# Patient Record
Sex: Female | Born: 1980 | Race: White | Hispanic: No | Marital: Single | Smoking: Current every day smoker
Health system: Southern US, Community
[De-identification: ages and names within clinical notes are randomized; demographics above are authoritative.]

## PROBLEM LIST (undated history)

## (undated) DIAGNOSIS — F32A Depression, unspecified: Secondary | ICD-10-CM

## (undated) DIAGNOSIS — F419 Anxiety disorder, unspecified: Secondary | ICD-10-CM

## (undated) DIAGNOSIS — M329 Systemic lupus erythematosus, unspecified: Secondary | ICD-10-CM

## (undated) DIAGNOSIS — D649 Anemia, unspecified: Secondary | ICD-10-CM

## (undated) DIAGNOSIS — IMO0002 Reserved for concepts with insufficient information to code with codable children: Secondary | ICD-10-CM

## (undated) DIAGNOSIS — M1990 Unspecified osteoarthritis, unspecified site: Secondary | ICD-10-CM

## (undated) DIAGNOSIS — F329 Major depressive disorder, single episode, unspecified: Secondary | ICD-10-CM

## (undated) DIAGNOSIS — F1910 Other psychoactive substance abuse, uncomplicated: Secondary | ICD-10-CM

## (undated) HISTORY — PX: OTHER SURGICAL HISTORY: SHX169

## (undated) HISTORY — PX: ABDOMINAL HYSTERECTOMY: SHX81

## (undated) HISTORY — PX: WISDOM TOOTH EXTRACTION: SHX21

---
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
## 2000-04-01 ENCOUNTER — Inpatient Hospital Stay (HOSPITAL_COMMUNITY): Admission: AD | Admit: 2000-04-01 | Discharge: 2000-04-01 | Payer: Self-pay | Admitting: *Deleted

## 2000-04-04 ENCOUNTER — Encounter: Payer: Self-pay | Admitting: Obstetrics

## 2000-04-04 ENCOUNTER — Inpatient Hospital Stay (HOSPITAL_COMMUNITY): Admission: AD | Admit: 2000-04-04 | Discharge: 2000-04-04 | Payer: Self-pay | Admitting: *Deleted

## 2000-04-12 ENCOUNTER — Inpatient Hospital Stay (HOSPITAL_COMMUNITY): Admission: AD | Admit: 2000-04-12 | Discharge: 2000-04-12 | Payer: Self-pay | Admitting: *Deleted

## 2000-04-17 ENCOUNTER — Inpatient Hospital Stay (HOSPITAL_COMMUNITY): Admission: AD | Admit: 2000-04-17 | Discharge: 2000-04-17 | Payer: Self-pay | Admitting: Obstetrics & Gynecology

## 2000-08-20 ENCOUNTER — Other Ambulatory Visit: Admission: RE | Admit: 2000-08-20 | Discharge: 2000-08-20 | Payer: Self-pay | Admitting: Obstetrics & Gynecology

## 2000-08-20 ENCOUNTER — Other Ambulatory Visit: Admission: RE | Admit: 2000-08-20 | Discharge: 2000-08-20 | Payer: Self-pay | Admitting: Family Medicine

## 2001-02-02 ENCOUNTER — Other Ambulatory Visit: Admission: RE | Admit: 2001-02-02 | Discharge: 2001-02-02 | Payer: Self-pay | Admitting: Obstetrics & Gynecology

## 2001-02-02 ENCOUNTER — Encounter (INDEPENDENT_AMBULATORY_CARE_PROVIDER_SITE_OTHER): Payer: Self-pay | Admitting: Specialist

## 2002-03-26 ENCOUNTER — Inpatient Hospital Stay (HOSPITAL_COMMUNITY): Admission: AD | Admit: 2002-03-26 | Discharge: 2002-03-26 | Payer: Self-pay | Admitting: *Deleted

## 2002-03-26 ENCOUNTER — Encounter: Payer: Self-pay | Admitting: *Deleted

## 2002-04-20 ENCOUNTER — Inpatient Hospital Stay (HOSPITAL_COMMUNITY): Admission: AD | Admit: 2002-04-20 | Discharge: 2002-04-20 | Payer: Self-pay | Admitting: *Deleted

## 2002-10-28 ENCOUNTER — Inpatient Hospital Stay (HOSPITAL_COMMUNITY): Admission: AD | Admit: 2002-10-28 | Discharge: 2002-10-28 | Payer: Self-pay | Admitting: Family Medicine

## 2002-10-28 ENCOUNTER — Encounter: Payer: Self-pay | Admitting: Obstetrics and Gynecology

## 2002-11-28 ENCOUNTER — Inpatient Hospital Stay (HOSPITAL_COMMUNITY): Admission: AD | Admit: 2002-11-28 | Discharge: 2002-11-28 | Payer: Self-pay | Admitting: *Deleted

## 2002-12-12 ENCOUNTER — Inpatient Hospital Stay (HOSPITAL_COMMUNITY): Admission: AD | Admit: 2002-12-12 | Discharge: 2002-12-12 | Payer: Self-pay | Admitting: *Deleted

## 2002-12-12 ENCOUNTER — Encounter: Payer: Self-pay | Admitting: Family Medicine

## 2003-01-05 ENCOUNTER — Other Ambulatory Visit: Admission: RE | Admit: 2003-01-05 | Discharge: 2003-01-05 | Payer: Self-pay | Admitting: Obstetrics and Gynecology

## 2004-07-30 ENCOUNTER — Encounter: Admission: RE | Admit: 2004-07-30 | Discharge: 2004-07-30 | Payer: Self-pay | Admitting: Family Medicine

## 2004-09-05 ENCOUNTER — Ambulatory Visit: Payer: Self-pay | Admitting: Family Medicine

## 2004-12-06 ENCOUNTER — Ambulatory Visit: Payer: Self-pay | Admitting: Family Medicine

## 2005-01-02 ENCOUNTER — Ambulatory Visit: Payer: Self-pay | Admitting: Family Medicine

## 2005-01-23 ENCOUNTER — Ambulatory Visit: Payer: Self-pay | Admitting: Internal Medicine

## 2005-04-07 HISTORY — PX: SEPTOPLASTY: SHX2393

## 2005-04-29 ENCOUNTER — Ambulatory Visit (HOSPITAL_COMMUNITY): Admission: RE | Admit: 2005-04-29 | Discharge: 2005-04-29 | Payer: Self-pay | Admitting: Otolaryngology

## 2005-04-29 ENCOUNTER — Ambulatory Visit (HOSPITAL_BASED_OUTPATIENT_CLINIC_OR_DEPARTMENT_OTHER): Admission: RE | Admit: 2005-04-29 | Discharge: 2005-04-29 | Payer: Self-pay | Admitting: Otolaryngology

## 2005-06-24 ENCOUNTER — Ambulatory Visit: Payer: Self-pay | Admitting: Internal Medicine

## 2005-07-29 ENCOUNTER — Inpatient Hospital Stay (HOSPITAL_COMMUNITY): Admission: AD | Admit: 2005-07-29 | Discharge: 2005-07-29 | Payer: Self-pay | Admitting: Family Medicine

## 2005-08-16 ENCOUNTER — Ambulatory Visit: Payer: Self-pay | Admitting: Family Medicine

## 2005-09-25 ENCOUNTER — Inpatient Hospital Stay (HOSPITAL_COMMUNITY): Admission: AD | Admit: 2005-09-25 | Discharge: 2005-09-25 | Payer: Self-pay | Admitting: Obstetrics

## 2005-12-10 ENCOUNTER — Other Ambulatory Visit: Admission: RE | Admit: 2005-12-10 | Discharge: 2005-12-10 | Payer: Self-pay | Admitting: Obstetrics and Gynecology

## 2006-01-07 ENCOUNTER — Ambulatory Visit: Payer: Self-pay | Admitting: Internal Medicine

## 2006-01-30 ENCOUNTER — Other Ambulatory Visit: Admission: RE | Admit: 2006-01-30 | Discharge: 2006-01-30 | Payer: Self-pay | Admitting: Obstetrics and Gynecology

## 2006-03-18 ENCOUNTER — Inpatient Hospital Stay (HOSPITAL_COMMUNITY): Admission: AD | Admit: 2006-03-18 | Discharge: 2006-03-18 | Payer: Self-pay | Admitting: Obstetrics and Gynecology

## 2006-04-11 ENCOUNTER — Ambulatory Visit: Payer: Self-pay | Admitting: Family Medicine

## 2006-08-21 ENCOUNTER — Inpatient Hospital Stay (HOSPITAL_COMMUNITY): Admission: AD | Admit: 2006-08-21 | Discharge: 2006-08-22 | Payer: Self-pay | Admitting: Obstetrics and Gynecology

## 2006-09-01 ENCOUNTER — Inpatient Hospital Stay (HOSPITAL_COMMUNITY): Admission: RE | Admit: 2006-09-01 | Discharge: 2006-09-03 | Payer: Self-pay | Admitting: Obstetrics and Gynecology

## 2006-11-14 ENCOUNTER — Ambulatory Visit: Payer: Self-pay | Admitting: Internal Medicine

## 2007-01-27 ENCOUNTER — Telehealth (INDEPENDENT_AMBULATORY_CARE_PROVIDER_SITE_OTHER): Payer: Self-pay | Admitting: *Deleted

## 2007-01-28 ENCOUNTER — Telehealth: Payer: Self-pay | Admitting: Internal Medicine

## 2007-02-10 ENCOUNTER — Telehealth: Payer: Self-pay | Admitting: Internal Medicine

## 2007-02-10 ENCOUNTER — Ambulatory Visit: Payer: Self-pay | Admitting: Internal Medicine

## 2007-02-10 DIAGNOSIS — M549 Dorsalgia, unspecified: Secondary | ICD-10-CM | POA: Insufficient documentation

## 2007-03-04 ENCOUNTER — Telehealth: Payer: Self-pay | Admitting: Family Medicine

## 2007-03-11 ENCOUNTER — Telehealth (INDEPENDENT_AMBULATORY_CARE_PROVIDER_SITE_OTHER): Payer: Self-pay | Admitting: *Deleted

## 2007-03-11 ENCOUNTER — Emergency Department (HOSPITAL_COMMUNITY): Admission: EM | Admit: 2007-03-11 | Discharge: 2007-03-11 | Payer: Self-pay | Admitting: Emergency Medicine

## 2007-03-16 ENCOUNTER — Telehealth: Payer: Self-pay | Admitting: Internal Medicine

## 2007-03-17 ENCOUNTER — Ambulatory Visit: Payer: Self-pay | Admitting: Internal Medicine

## 2007-04-08 ENCOUNTER — Telehealth: Payer: Self-pay | Admitting: Internal Medicine

## 2007-04-14 ENCOUNTER — Encounter: Payer: Self-pay | Admitting: Internal Medicine

## 2007-04-27 ENCOUNTER — Ambulatory Visit: Payer: Self-pay | Admitting: Internal Medicine

## 2007-04-27 DIAGNOSIS — J069 Acute upper respiratory infection, unspecified: Secondary | ICD-10-CM | POA: Insufficient documentation

## 2007-07-07 ENCOUNTER — Emergency Department (HOSPITAL_COMMUNITY): Admission: EM | Admit: 2007-07-07 | Discharge: 2007-07-07 | Payer: Self-pay | Admitting: Emergency Medicine

## 2007-09-29 ENCOUNTER — Telehealth: Payer: Self-pay | Admitting: Internal Medicine

## 2007-10-01 ENCOUNTER — Ambulatory Visit: Payer: Self-pay | Admitting: Internal Medicine

## 2007-10-01 DIAGNOSIS — M545 Low back pain: Secondary | ICD-10-CM

## 2007-10-01 DIAGNOSIS — J019 Acute sinusitis, unspecified: Secondary | ICD-10-CM

## 2007-10-05 ENCOUNTER — Encounter: Payer: Self-pay | Admitting: Internal Medicine

## 2007-12-04 ENCOUNTER — Emergency Department (HOSPITAL_COMMUNITY): Admission: EM | Admit: 2007-12-04 | Discharge: 2007-12-04 | Payer: Self-pay | Admitting: Emergency Medicine

## 2007-12-13 ENCOUNTER — Emergency Department (HOSPITAL_COMMUNITY): Admission: EM | Admit: 2007-12-13 | Discharge: 2007-12-13 | Payer: Self-pay | Admitting: Emergency Medicine

## 2007-12-17 ENCOUNTER — Encounter: Payer: Self-pay | Admitting: Internal Medicine

## 2008-02-05 ENCOUNTER — Emergency Department (HOSPITAL_COMMUNITY): Admission: EM | Admit: 2008-02-05 | Discharge: 2008-02-05 | Payer: Self-pay | Admitting: Emergency Medicine

## 2008-02-20 ENCOUNTER — Emergency Department (HOSPITAL_COMMUNITY): Admission: EM | Admit: 2008-02-20 | Discharge: 2008-02-20 | Payer: Self-pay | Admitting: Emergency Medicine

## 2008-04-04 ENCOUNTER — Ambulatory Visit: Payer: Self-pay | Admitting: Internal Medicine

## 2008-04-06 ENCOUNTER — Telehealth: Payer: Self-pay | Admitting: Internal Medicine

## 2008-05-06 ENCOUNTER — Telehealth: Payer: Self-pay | Admitting: Internal Medicine

## 2008-05-09 ENCOUNTER — Ambulatory Visit: Payer: Self-pay | Admitting: Internal Medicine

## 2008-07-18 ENCOUNTER — Telehealth: Payer: Self-pay | Admitting: Internal Medicine

## 2008-08-02 ENCOUNTER — Telehealth: Payer: Self-pay | Admitting: Internal Medicine

## 2008-08-03 ENCOUNTER — Ambulatory Visit: Payer: Self-pay | Admitting: Internal Medicine

## 2008-08-16 ENCOUNTER — Telehealth: Payer: Self-pay | Admitting: Internal Medicine

## 2008-08-18 ENCOUNTER — Ambulatory Visit: Payer: Self-pay | Admitting: Internal Medicine

## 2008-09-14 ENCOUNTER — Ambulatory Visit: Payer: Self-pay | Admitting: Internal Medicine

## 2008-09-14 DIAGNOSIS — L0292 Furuncle, unspecified: Secondary | ICD-10-CM | POA: Insufficient documentation

## 2008-09-14 DIAGNOSIS — L0293 Carbuncle, unspecified: Secondary | ICD-10-CM

## 2008-10-12 ENCOUNTER — Telehealth: Payer: Self-pay | Admitting: Internal Medicine

## 2008-12-01 ENCOUNTER — Ambulatory Visit: Payer: Self-pay | Admitting: Internal Medicine

## 2008-12-01 DIAGNOSIS — M2550 Pain in unspecified joint: Secondary | ICD-10-CM

## 2008-12-01 LAB — CONVERTED CEMR LAB: Cyclic Citrullin Peptide Ab: 1.4 units (ref <7)

## 2008-12-02 ENCOUNTER — Telehealth: Payer: Self-pay | Admitting: Internal Medicine

## 2008-12-13 ENCOUNTER — Telehealth: Payer: Self-pay | Admitting: Internal Medicine

## 2008-12-20 ENCOUNTER — Telehealth: Payer: Self-pay | Admitting: Internal Medicine

## 2008-12-21 ENCOUNTER — Emergency Department (HOSPITAL_BASED_OUTPATIENT_CLINIC_OR_DEPARTMENT_OTHER): Admission: EM | Admit: 2008-12-21 | Discharge: 2008-12-21 | Payer: Self-pay | Admitting: Emergency Medicine

## 2008-12-21 ENCOUNTER — Emergency Department (HOSPITAL_COMMUNITY): Admission: EM | Admit: 2008-12-21 | Discharge: 2008-12-21 | Payer: Self-pay | Admitting: Family Medicine

## 2008-12-22 ENCOUNTER — Ambulatory Visit: Payer: Self-pay | Admitting: Internal Medicine

## 2008-12-22 LAB — CONVERTED CEMR LAB: Uric Acid, Serum: 4 mg/dL (ref 2.4–7.0)

## 2008-12-23 ENCOUNTER — Telehealth: Payer: Self-pay | Admitting: Internal Medicine

## 2008-12-30 ENCOUNTER — Encounter: Payer: Self-pay | Admitting: Internal Medicine

## 2008-12-30 ENCOUNTER — Telehealth: Payer: Self-pay | Admitting: Internal Medicine

## 2009-01-23 ENCOUNTER — Telehealth: Payer: Self-pay | Admitting: Internal Medicine

## 2009-02-10 ENCOUNTER — Ambulatory Visit: Payer: Self-pay | Admitting: Internal Medicine

## 2009-02-13 ENCOUNTER — Ambulatory Visit: Payer: Self-pay | Admitting: Internal Medicine

## 2009-02-14 ENCOUNTER — Telehealth: Payer: Self-pay | Admitting: Internal Medicine

## 2009-02-16 ENCOUNTER — Encounter (INDEPENDENT_AMBULATORY_CARE_PROVIDER_SITE_OTHER): Payer: Self-pay | Admitting: *Deleted

## 2009-02-16 ENCOUNTER — Telehealth: Payer: Self-pay | Admitting: Internal Medicine

## 2009-02-25 ENCOUNTER — Inpatient Hospital Stay (HOSPITAL_COMMUNITY): Admission: AD | Admit: 2009-02-25 | Discharge: 2009-02-25 | Payer: Self-pay | Admitting: Obstetrics & Gynecology

## 2009-03-02 ENCOUNTER — Telehealth: Payer: Self-pay | Admitting: Family Medicine

## 2009-03-07 ENCOUNTER — Telehealth: Payer: Self-pay | Admitting: Internal Medicine

## 2009-03-31 ENCOUNTER — Emergency Department (HOSPITAL_COMMUNITY): Admission: EM | Admit: 2009-03-31 | Discharge: 2009-03-31 | Payer: Self-pay | Admitting: Emergency Medicine

## 2009-04-03 ENCOUNTER — Telehealth (INDEPENDENT_AMBULATORY_CARE_PROVIDER_SITE_OTHER): Payer: Self-pay | Admitting: *Deleted

## 2009-04-13 ENCOUNTER — Ambulatory Visit: Payer: Self-pay | Admitting: Internal Medicine

## 2009-04-13 DIAGNOSIS — F17200 Nicotine dependence, unspecified, uncomplicated: Secondary | ICD-10-CM

## 2009-04-20 ENCOUNTER — Telehealth: Payer: Self-pay | Admitting: Internal Medicine

## 2009-04-21 ENCOUNTER — Ambulatory Visit: Payer: Self-pay | Admitting: Internal Medicine

## 2009-04-21 DIAGNOSIS — M069 Rheumatoid arthritis, unspecified: Secondary | ICD-10-CM | POA: Insufficient documentation

## 2009-04-24 ENCOUNTER — Telehealth: Payer: Self-pay | Admitting: Internal Medicine

## 2009-05-09 ENCOUNTER — Telehealth: Payer: Self-pay | Admitting: Internal Medicine

## 2009-05-09 ENCOUNTER — Encounter (INDEPENDENT_AMBULATORY_CARE_PROVIDER_SITE_OTHER): Payer: Self-pay | Admitting: *Deleted

## 2009-05-15 ENCOUNTER — Telehealth: Payer: Self-pay | Admitting: Internal Medicine

## 2009-05-17 ENCOUNTER — Emergency Department (HOSPITAL_BASED_OUTPATIENT_CLINIC_OR_DEPARTMENT_OTHER): Admission: EM | Admit: 2009-05-17 | Discharge: 2009-05-17 | Payer: Self-pay | Admitting: Emergency Medicine

## 2009-05-22 ENCOUNTER — Telehealth: Payer: Self-pay | Admitting: Internal Medicine

## 2009-05-29 ENCOUNTER — Encounter: Payer: Self-pay | Admitting: Internal Medicine

## 2009-06-16 ENCOUNTER — Telehealth: Payer: Self-pay | Admitting: Internal Medicine

## 2009-06-20 ENCOUNTER — Ambulatory Visit: Payer: Self-pay | Admitting: Internal Medicine

## 2009-06-27 ENCOUNTER — Telehealth (INDEPENDENT_AMBULATORY_CARE_PROVIDER_SITE_OTHER): Payer: Self-pay | Admitting: *Deleted

## 2009-06-28 ENCOUNTER — Telehealth: Payer: Self-pay | Admitting: Internal Medicine

## 2009-06-28 ENCOUNTER — Telehealth (INDEPENDENT_AMBULATORY_CARE_PROVIDER_SITE_OTHER): Payer: Self-pay | Admitting: *Deleted

## 2009-07-17 ENCOUNTER — Ambulatory Visit: Payer: Self-pay | Admitting: Internal Medicine

## 2009-07-18 LAB — CONVERTED CEMR LAB

## 2009-07-20 ENCOUNTER — Telehealth: Payer: Self-pay | Admitting: Internal Medicine

## 2009-08-10 ENCOUNTER — Encounter: Payer: Self-pay | Admitting: Internal Medicine

## 2009-08-15 ENCOUNTER — Ambulatory Visit: Payer: Self-pay | Admitting: Internal Medicine

## 2009-08-15 DIAGNOSIS — R609 Edema, unspecified: Secondary | ICD-10-CM

## 2009-08-17 ENCOUNTER — Telehealth: Payer: Self-pay | Admitting: Internal Medicine

## 2009-08-24 ENCOUNTER — Telehealth: Payer: Self-pay | Admitting: Internal Medicine

## 2009-09-13 ENCOUNTER — Telehealth: Payer: Self-pay | Admitting: Internal Medicine

## 2009-09-26 ENCOUNTER — Emergency Department (HOSPITAL_COMMUNITY): Admission: EM | Admit: 2009-09-26 | Discharge: 2009-09-26 | Payer: Self-pay | Admitting: Emergency Medicine

## 2009-10-13 ENCOUNTER — Telehealth: Payer: Self-pay | Admitting: Internal Medicine

## 2009-10-16 ENCOUNTER — Telehealth: Payer: Self-pay | Admitting: Internal Medicine

## 2009-11-13 ENCOUNTER — Telehealth: Payer: Self-pay | Admitting: Internal Medicine

## 2009-12-05 ENCOUNTER — Telehealth: Payer: Self-pay | Admitting: Internal Medicine

## 2009-12-15 ENCOUNTER — Telehealth: Payer: Self-pay | Admitting: Internal Medicine

## 2009-12-31 ENCOUNTER — Emergency Department (HOSPITAL_BASED_OUTPATIENT_CLINIC_OR_DEPARTMENT_OTHER): Admission: EM | Admit: 2009-12-31 | Discharge: 2009-12-31 | Payer: Self-pay | Admitting: Emergency Medicine

## 2010-01-02 ENCOUNTER — Telehealth: Payer: Self-pay | Admitting: Internal Medicine

## 2010-01-03 ENCOUNTER — Telehealth: Payer: Self-pay | Admitting: Internal Medicine

## 2010-01-05 HISTORY — PX: DIAGNOSTIC LAPAROSCOPY: SUR761

## 2010-01-25 ENCOUNTER — Ambulatory Visit: Payer: Self-pay | Admitting: Diagnostic Radiology

## 2010-01-25 ENCOUNTER — Emergency Department (HOSPITAL_BASED_OUTPATIENT_CLINIC_OR_DEPARTMENT_OTHER): Admission: EM | Admit: 2010-01-25 | Discharge: 2010-01-25 | Payer: Self-pay | Admitting: Emergency Medicine

## 2010-01-27 ENCOUNTER — Inpatient Hospital Stay (HOSPITAL_COMMUNITY): Admission: EM | Admit: 2010-01-27 | Discharge: 2010-02-05 | Payer: Self-pay | Admitting: Emergency Medicine

## 2010-01-30 ENCOUNTER — Encounter (INDEPENDENT_AMBULATORY_CARE_PROVIDER_SITE_OTHER): Payer: Self-pay | Admitting: General Surgery

## 2010-02-18 ENCOUNTER — Inpatient Hospital Stay (HOSPITAL_COMMUNITY): Admission: EM | Admit: 2010-02-18 | Discharge: 2010-02-21 | Payer: Self-pay | Admitting: Emergency Medicine

## 2010-03-05 ENCOUNTER — Telehealth: Payer: Self-pay | Admitting: Internal Medicine

## 2010-03-06 ENCOUNTER — Ambulatory Visit: Payer: Self-pay | Admitting: Internal Medicine

## 2010-03-06 ENCOUNTER — Telehealth: Payer: Self-pay | Admitting: Internal Medicine

## 2010-03-07 ENCOUNTER — Telehealth: Payer: Self-pay | Admitting: Internal Medicine

## 2010-04-12 ENCOUNTER — Telehealth: Payer: Self-pay | Admitting: Internal Medicine

## 2010-04-19 ENCOUNTER — Encounter
Admission: RE | Admit: 2010-04-19 | Discharge: 2010-04-19 | Payer: Self-pay | Source: Home / Self Care | Attending: Physical Medicine & Rehabilitation | Admitting: Physical Medicine & Rehabilitation

## 2010-08-07 NOTE — Progress Notes (Signed)
Summary: REQ FOR REFILL (HYDROCODONE)  Phone Note Call from Patient   Caller: Patient  [phone] Reason for Call: Refill Medication Summary of Call: Pt called to req a refill on med: HYDROCODONE-ACETAMINOPHEN 7.5-500 MG TABS..... Pt adv that she is currently out of med and would like Rx for same....[facility] [place] Pt can be reached at [phone].  Pt doesn't have appt with Rheumatologist till April 28th, 2011.... Pt completely out of med.  Initial call taken by: Debbra Riding,  October 16, 2009 4:04 PM  Follow-up for Phone Call        #90  No RF  Follow-up by: Gordy Savers  MD,  October 16, 2009 4:14 PM    Prescriptions: HYDROCODONE-ACETAMINOPHEN 7.5-500 MG TABS (HYDROCODONE-ACETAMINOPHEN) one every 8 hours as needed for pain  #90 x 0   Entered and Authorized by:   Duard Brady LPN   Signed by:   Duard Brady LPN on 24/40/1027   Method used:   Historical   RxID:   [id number]  [facility] - [facility] aware. KIK

## 2010-08-07 NOTE — Progress Notes (Signed)
Summary: refill pain med  Phone Note Call from Patient Call back at Home Phone [phone]   Reason for Call: Refill Medication Summary of Call: rEFILL OF PAIN MED #50 Taking One every 8 hours.  Completely out several days.  Had to reschedule appt rheumatologist, lady supposed to fill in for me was unable to fill in, to July 26.   Initial call taken by: Rudy Jew, RN,  Dec 05, 2009 9:45 AM  Follow-up for Phone Call        #50 notify patient that all further pain meds must be filled by rheumatology Follow-up by: Gordy Savers  MD,  Dec 05, 2009 1:01 PM  Additional Follow-up for Phone Call Additional follow up Details #1::        Left message to inform.   Additional Follow-up by: Rudy Jew, RN,  Dec 05, 2009 2:05 PM    Prescriptions: HYDROCODONE-ACETAMINOPHEN 7.5-500 MG TABS (HYDROCODONE-ACETAMINOPHEN) one every 8 hours as needed for pain  #50 x 0   Entered by:   Rudy Jew, RN   Authorized by:   Gordy Savers  MD   Signed by:   Rudy Jew, RN on 12/05/2009   Method used:   Telephoned to ...       [facility]  [place] [address] (retail)       [address]       [place], [place]  [address]       Ph: [phone] or [phone]       Fax: [phone]   RxID:   [id number]

## 2010-08-07 NOTE — Assessment & Plan Note (Signed)
Summary: fup meds//ccm   Vital Signs:  Patient profile:   30 year old female Weight:      164 pounds Temp:     97.4 degrees F oral BP sitting:   118 / 70  (right arm) Cuff size:   regular  Vitals Entered By: Duard Brady LPN (March 06, 2010 10:54 AM) CC: pain meds , not seeing rhematologist , post hospital Is Patient Diabetic? No   Primary Care Provider:  Gordy Savers  MD  CC:  pain meds , not seeing rhematologist , and post hospital.  History of Present Illness: 30 year old patient who has a history of seronegative R. A. since her last visit here, she has been hospitalized on two occasions for abdominal pain.  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 Layne Benton.  She also has a rheumatologic follow-up with Dr. Ancil Linsey.  She remains on prednisone 20 mg daily.  She feels that her arthritis has been stable.  She states this is not required pain medications for a couple of weeks. 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 mother with history of DJD maternal grandmother and maternal great-grandmother with rheumatoid arthritis  Social History: Reviewed history from 04/13/2009 and no changes required. 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  Due to arthritis  Review of Systems  The patient denies anorexia, fever, weight loss, weight  gain, vision loss, decreased hearing, hoarseness, chest pain, syncope, dyspnea on exertion, peripheral edema, prolonged cough, headaches, hemoptysis, abdominal pain, melena, hematochezia, severe indigestion/heartburn, hematuria, incontinence, genital sores, muscle weakness, suspicious skin lesions, transient blindness, difficulty walking, depression, unusual weight change, abnormal bleeding, enlarged lymph nodes, angioedema, and breast masses.    Physical Exam  General:  overweight-appearing.   Head:  Normocephalic and atraumatic without obvious abnormalities. No apparent alopecia or balding. Mouth:  Oral mucosa and oropharynx without lesions or exudates.  Teeth in good repair. Neck:  No deformities, masses, or tenderness noted. Lungs:  Normal respiratory effort, chest expands symmetrically. Lungs are clear to auscultation, no crackles or wheezes. Heart:  Normal rate and regular rhythm. S1 and S2 normal without gallop, murmur, click, rub or other extra sounds. Abdomen:  Bowel sounds positive,abdomen soft and non-tender without masses, organomegaly or hernias noted. 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 One every 8 hours as needed for pain 2)  Prednisone 10 Mg Tabs (Prednisone) .... Once daily 3)  Prednisone 5 Mg Tabs (Prednisone) .... One daily  Patient Instructions: 1)  follow up with rheumatology  and  gynecology as scheduled 2)  Limit your Sodium (Salt). 3)  Tobacco is very bad for your health and your loved ones! You Should  stop smoking!. Prescriptions: PREDNISONE 5 MG TABS (PREDNISONE) one daily  #50 x 0   Entered and Authorized by:   Gordy Savers  MD    Signed by:   Gordy Savers  MD on 03/06/2010   Method used:   Print then Give to Patient   RxID:   [id number] PREDNISONE 10 MG TABS (PREDNISONE) once daily  #50 x 0   Entered and Authorized by:   Gordy Savers  MD   Signed by:   Gordy Savers  MD on 03/06/2010   Method used:   Print then Give to Patient   RxID:   [id number]

## 2010-08-07 NOTE — Progress Notes (Signed)
Summary:  refill of prednisone  Phone Note Call from Patient Call back at Home Phone [phone]   Caller: Patient Summary of Call: Pt called and said that they are completely out of predinisone.   [facility].  Initial call taken by: Lucy Antigua,  December 15, 2009 1:22 PM  Follow-up for Phone Call        prednisone 10 mg, number 30 Follow-up by: Gordy Savers  MD,  December 15, 2009 2:57 PM    Prescriptions: PREDNISONE 10 MG TABS (PREDNISONE) 2 once daily  #30 x 0   Entered by:   Duard Brady LPN   Authorized by:   Gordy Savers  MD   Signed by:   Duard Brady LPN on 09/81/1914   Method used:   Faxed to ...       [facility]  [place] [address]* (retail)       [address]       [place], [place]  [address]       Ph: [phone] or [phone]       Fax: [phone]   RxID:   [id number]

## 2010-08-07 NOTE — Assessment & Plan Note (Signed)
Summary: pain & swelling  ifFor for an  Vital Signs:  Patient profile:   30 year old female Weight:      183 pounds Temp:     98.3 degrees F oral BP sitting:   108 / 68  (right arm) Cuff size:   regular  Vitals Entered By: Raechel Ache, RN (August 15, 2009 9:33 AM) andCC: c/o swelling in extermities   Primary Care Provider:  Gordy Savers  MD  CC:  c/o swelling in extermities.  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  Rheumatoid arthritis.  Presently, she is on 20 mg of prednisone daily  and has had no significant arthritic pain.  She has had swelling involving primarily the hands and feet.  Unfortunately, she has canceled and rescheduled.  Rheumatology follow-up and remains on a 20-mg dose.  Compliance issues discussed.  Allergies: No Known Drug Allergies  Past History:  Past Medical History: Reviewed history from 06/20/2009 and no changes required. 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  The patient denies anorexia, fever, weight loss, vision loss, decreased hearing, hoarseness, chest pain, syncope, dyspnea on exertion, prolonged cough, headaches, hemoptysis, abdominal pain, melena, hematochezia, severe indigestion/heartburn, hematuria, incontinence, genital sores, muscle weakness, suspicious skin lesions, transient blindness, difficulty walking, depression, unusual weight change, abnormal bleeding, enlarged lymph nodes, angioedema, and breast masses.    Physical Exam  General:  overweight-appearing.  low-normal blood pressureoverweight-appearing.   Head:  Normocephalic and atraumatic without obvious abnormalities. No apparent alopecia or balding. Mouth:  no oral ulcerations Neck:  No deformities, masses, or tenderness noted. Lungs:  Normal respiratory  effort, chest expands symmetrically. Lungs are clear to auscultation, no crackles or wheezes. Heart:  Normal rate and regular rhythm. S1 and S2 normal without gallop, murmur, click, rub or other extra sounds. 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 One daily    Her updated medication list for this problem includes:    Furosemide 40 Mg Tabs (Furosemide) ..... One daily  Complete Medication List: 1)  Hydrocodone-acetaminophen 7.5-500 Mg Tabs (Hydrocodone-acetaminophen) .... One every 8 hours as needed for pain 2)  Prednisone 10 Mg Tabs (Prednisone) .... 2 once daily 3)  Furosemide 40 Mg Tabs (Furosemide) .... One daily  Other Orders: Prescription Created Electronically [phone])  Patient Instructions: 1)  Please schedule a follow-up appointment in 3 months. 2)  Limit your Sodium (Salt). 3)  It is important that you exercise regularly at least 20 minutes 5 times a week. If you develop chest pain, have severe difficulty breathing, or feel very tired , stop exercising immediately and seek medical attention. 4)  You need to lose weight. Consider a lower calorie diet and regular exercise.  Prescriptions: FUROSEMIDE 40 MG TABS (FUROSEMIDE) one daily  #90 x 0   Entered and Authorized by:   Gordy Savers  MD   Signed by:   Gordy Savers  MD on 08/15/2009   Method used:   Electronically to  [facility]  [place] [address] (retail)       [address]       [place], [place]  [address]       Ph: [phone] or [phone]       Fax: [phone]   RxID:   [id number]

## 2010-08-07 NOTE — Progress Notes (Signed)
Summary: refill hydrocodone- acteaminophen  Phone Note Refill Request   Refills Requested: Medication #1:  HYDROCODONE-ACETAMINOPHEN 7.5-500 MG TABS one every 8 hours as needed for pain   [facility] [phone]   Method Requested: Telephone to Pharmacy Initial call taken by: Duard Brady LPN,  Nov 13, 6960 9:26 AM  Follow-up for Phone Call        rec'd voive mail from pt requesting refill on hydrocodone - 'it is time, it has been 30 days'  Follow-up by: Duard Brady LPN,  Nov 14, 9526 9:27 AM  Additional Follow-up for Phone Call Additional follow up Details #1::        RF is due 11-15-2009; has patient been evaluated by Rheumatology?? Additional Follow-up by: Gordy Savers  MD,  Nov 13, 2009 12:41 PM    Additional Follow-up for Phone Call Additional follow up Details #2::    called pt - informed that pain med due to be refilled on 5/11 - asked if she have ever seen Rheumatology - will see Dr. Ancil Linsey on thursday for the first time. Will inform Dr. Amador Cunas. KIK  Follow-up by: Duard Brady LPN,  Nov 14, 4130 1:28 PM  Additional Follow-up for Phone Call Additional follow up Details #3:: Details for Additional Follow-up Action Taken: noted; rheumatology can manage  her pain medicine at that time; OK to call in number 50 Additional Follow-up by: Gordy Savers  MD,  Nov 13, 2009 2:17 PM  Prescriptions: HYDROCODONE-ACETAMINOPHEN 7.5-500 MG TABS (HYDROCODONE-ACETAMINOPHEN) one every 8 hours as needed for pain  #50 x 0   Entered by:   Duard Brady LPN   Authorized by:   Gordy Savers  MD   Signed by:   Duard Brady LPN on 44/07/270   Method used:   Historical   RxID:   [id number] HYDROCODONE-ACETAMINOPHEN 7.5-500 MG TABS (HYDROCODONE-ACETAMINOPHEN) one every 8 hours as needed for pain  #50 x 1   Entered and Authorized by:   Duard Brady LPN   Signed by:   Duard Brady LPN on 38/75/6433  Method used:   Historical   RxID:   [id number]  called in #50 no rf - pt aware rheumatology will manage pain meds . KIK

## 2010-08-07 NOTE — Progress Notes (Signed)
Summary: Request for Meds  Phone Note Call from Patient Call back at Home Phone [phone]   Caller: Patient Summary of Call: Pt missed appt this morning to see rheumatologist and requests to have refill of hydrocodone. Per Dr. Kirtland Bouchard patient needs to be seen by [facility] as listed on Medicaid card. Initial call taken by: Trixie Dredge,  April 12, 2010 11:45 AM

## 2010-08-07 NOTE — Progress Notes (Signed)
Summary: refill  Phone Note Call from Patient Call back at Home Phone [phone]   Caller: Patient---live call Reason for Call: Refill Medication Summary of Call: [facility] Initial call taken by: Warnell Forester,  August 17, 2009 12:45 PM  Follow-up for Phone Call        pt called again and is out of meds. Needs today. Follow-up by: Warnell Forester,  August 17, 2009 3:38 PM    Prescriptions: HYDROCODONE-ACETAMINOPHEN 7.5-500 MG TABS (HYDROCODONE-ACETAMINOPHEN) one every 8 hours as needed for pain  #100 x 0   Entered by:   Duard Brady LPN   Authorized by:   Gordy Savers  MD   Signed by:   Duard Brady LPN on 14/78/2956   Method used:   Historical   RxID:   [id number]  OK  #100

## 2010-08-07 NOTE — Progress Notes (Signed)
Summary: rf - pain med - appt not till 7/26    [facility]  [facility] Call from Patient   Caller: Patient Call For: Gordy Savers  MD Reason for Call: Refill Medication Summary of Call: does not see Dr. Ancil Linsey until July 26 - appt were scheduled out that far - hard to get in earlier for new pt.  Needs refill on pain med. KIK Initial call taken by: Duard Brady LPN,  January 03, 2010 12:25 PM  Follow-up for Phone Call        #100 -all further meds per rheumatology Follow-up by: Gordy Savers  MD,  January 03, 2010 12:47 PM  Additional Follow-up for Phone Call Additional follow up Details #1::        called in refill - attempt to call -voice mail - LMTCB if questions - [facility] - must last until rhematology appt - if needed refill must see [place] . KIK Additional Follow-up by: Duard Brady LPN,  January 03, 2010 1:00 PM    Prescriptions: HYDROCODONE-ACETAMINOPHEN 7.5-500 MG TABS (HYDROCODONE-ACETAMINOPHEN) one every 8 hours as needed for pain  #100 x 0   Entered by:   Duard Brady LPN   Authorized by:   Gordy Savers  MD   Signed by:   Duard Brady LPN on 60/45/4098   Method used:   Historical   RxID:   [id number]

## 2010-08-07 NOTE — Assessment & Plan Note (Signed)
Summary: needs shot//ccm   Vital Signs:  Patient profile:   30 year old female Weight:      180 pounds Temp:     98.3 degrees F oral BP sitting:   128 / 96  (left arm) Cuff size:   regular  Vitals Entered By: Raechel Ache, RN (July 17, 2009 4:05 PM) CC: C/o sore R ankle, painfule and swollen joints, sores on tongue.   Primary Care Provider:  Gordy Savers  MD  CC:  C/o sore R ankle, painfule and swollen joints, and sores on tongue.Marland Kitchen  History of Present Illness: 30 year old patient who is seen today for follow-up of her polyarthralgias.  She is now on prednisone 20 mg daily and is scheduled for rheumatology follow-up in 3 weeks.  She is doing reasonably well.  Today this has some pain and stiffness about the small joints of her left hand.  She has some pain about her right ankle related to a prior soft tissue infection secondary to trauma.  This is improving.  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  No history of genital ulcerations; she  does request an HIV and syphilis test today  Allergies: No Known Drug Allergies  Past History:  Past Medical History: Reviewed history from 06/20/2009 and no changes required. 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 Repeat  blood pressure down to 121/82 Head:  Normocephalic and atraumatic without obvious abnormalities. No apparent alopecia or balding. Mouth:  Oral mucosa and oropharynx without lesions or exudates.  Teeth in good repair. Neck:  No deformities, masses, or tenderness noted. Lungs:  Normal respiratory effort, chest expands symmetrically. Lungs are clear to auscultation, no crackles or wheezes. Heart:  Normal rate and regular rhythm. S1 and S2 normal without gallop, murmur, click, rub or other extra sounds. Msk:  some  stiffness with left handgrip Skin:   ulceration involving the flexor surface of her right ankle due to prior trauma   Impression & Recommendations:  Problem # 1:  PAIN IN JOINT, MULTIPLE SITES (ICD-719.49)  Problem # 2:  FURUNCULITIS (ICD-680.9)  Orders: Venipuncture ([address]) T-HIV Antibody  (Reflex) ([address]11) T-RPR (Syphilis) ([address]78-[address])  Complete Medication List: 1)  Hydrocodone-acetaminophen 7.5-500 Mg Tabs (Hydrocodone-acetaminophen) .... One every 8 hours as needed for pain 2)  Prednisone 10 Mg Tabs (Prednisone) .... 2 once daily  Patient Instructions: 1)  Please schedule a follow-up appointment in 1 month. 2)  Please schedule a follow-up appointment in 3 months. 3)  Limit your Sodium (Salt). 4)  It is important that you exercise regularly at least 20 minutes 5 times a week. If you develop chest pain, have severe difficulty breathing, or feel very tired , stop exercising immediately and seek medical attention. 5)  You need to lose weight. Consider a lower calorie diet and regular exercise.  Prescriptions: PREDNISONE 10 MG TABS (PREDNISONE) 2 once daily  #90 x 0   Entered and Authorized by:   Gordy Savers  MD   Signed by:   Gordy Savers  MD on 07/17/2009   Method used:   Print then Give to Patient   RxID:   [id number] HYDROCODONE-ACETAMINOPHEN 7.5-500 MG TABS (HYDROCODONE-ACETAMINOPHEN) one every 8 hours as needed for pain  #100 x 0   Entered and Authorized by:   Gordy Savers  MD   Signed by:  Gordy Savers  MD on 07/17/2009   Method used:   Print then Give to Patient   RxID:   [id number]

## 2010-08-07 NOTE — Progress Notes (Signed)
Summary: REFILL REQUEST  Phone Note Call from Patient   Caller: Patient   [phone] Summary of Call: Pt called to let Dr Kirtland Bouchard know that she is still exp pain every day...Marland KitchenMarland Kitchen Pt adv she doesn't have insurance so she is not able to see her Rhuematologist.... Pt adv that she has been on pain meds for years due to arthritic knee pain and she needs pain med to help with pain due to same.... Pt req a Rx for pain med ( Hydrocodone  7.5-500 )......   [facility].  Initial call taken by: Debbra Riding,  March 07, 2010 1:20 PM  Follow-up for Phone Call        Patient must see a rheumatologist-this office unable to continue filling controlled drugs- must treat underlying disease. Follow-up by: Gordy Savers  MD,  March 08, 2010 8:09 AM  Additional Follow-up for Phone Call Additional follow up Details #1::        Called and spoke with pt .... explained Dr Charm Rings instructions...Marland KitchenMarland Kitchen pt acknowledged same.  Additional Follow-up by: Debbra Riding,  March 08, 2010 8:27 AM

## 2010-08-07 NOTE — Progress Notes (Signed)
Summary: requesting results  Phone Note Call from Patient Call back at Home Phone [phone]   Caller: Patient-LIVE CALL Reason for Call: Talk to Nurse Summary of Call: requesting lab results.  Initial call taken by: Warnell Forester,  July 20, 2009 3:27 PM  Follow-up for Phone Call        Phone Call Completed Follow-up by: Raechel Ache, RN,  July 20, 2009 3:38 PM

## 2010-08-07 NOTE — Progress Notes (Signed)
Summary: refill hydrocone    denied  Phone Note Call from Patient   Caller: Patient Call For: Gordy Savers  MD Summary of Call: Pt is calling for refill on Hydrocodone.  [phone]  [facility] Initial call taken by: Lynann Beaver CMA,  January 02, 2010 10:50 AM  Follow-up for Phone Call        all arthritis medications, including pain medicine should be refilled by rheumatology Follow-up by: Gordy Savers  MD,  January 02, 2010 12:57 PM  Additional Follow-up for Phone Call Additional follow up Details #1::        attempt to call - voice mail - LMTCB if questions - refill pain med denied - all pain med and arthritis med to be fill by rheumatology   KIK Additional Follow-up by: Duard Brady LPN,  January 03, 2010 9:25 AM

## 2010-08-07 NOTE — Progress Notes (Signed)
Summary: refill vicodan  Phone Note Call from Patient Call back at Home Phone [phone]   Caller: Patient Call For: Gordy Savers  MD Summary of Call: asking for refill Hydrocodone. Has appt with new rheumatologist 10/09/09. [facility] Initial call taken by: Raechel Ache, RN,  September 13, 2009 11:09 AM  Follow-up for Phone Call        Pt called to ck on status of refill...Marland KitchenMarland KitchenDebbra Riding, September 13, 2009 11:56AM  Follow-up by: Debbra Riding,  September 13, 2009 11:56 AM  Additional Follow-up for Phone Call Additional follow up Details #1::        #100 Additional Follow-up by: Gordy Savers  MD,  September 13, 2009 12:33 PM    Prescriptions: HYDROCODONE-ACETAMINOPHEN 7.5-500 MG TABS (HYDROCODONE-ACETAMINOPHEN) one every 8 hours as needed for pain  #100 x 0   Entered by:   Duard Brady LPN   Authorized by:   Gordy Savers  MD   Signed by:   Duard Brady LPN on 05/04/2535   Method used:   Historical   RxID:   [id number]   Appended Document: refill vicodan called hm # ans mach - LMTCB if problems , rx filled at [facility]

## 2010-08-07 NOTE — Progress Notes (Signed)
Summary: wants pain meds without an ov.  Phone Note Call from Patient Call back at Work Phone [phone]   Caller: Patient----live call Summary of Call: pt had surgery for bile issues. cannot afford to see rheumatologists. wants pain meds. pt made an appt for tomorrow. Wants Kim to call her today. Initial call taken by: Warnell Forester,  March 05, 2010 1:27 PM  Follow-up for Phone Call        called pt - per pt - she longer has is - was hospitalized for bowel obstruction in July - was unable to see rheumatologist - in alot of pain, has  appt tomorrow .   Per Dr. Frederica Kuster - will address tomorrow. KIK Follow-up by: Duard Brady LPN,  March 05, 2010 1:56 PM

## 2010-08-07 NOTE — Progress Notes (Signed)
Summary: referral  Phone Note Call from Patient   Caller: Patient Call For: Gordy Savers  MD Summary of Call: wants referral for arthritis. Dr Kellie Simmering didn't work out. Has medicaid. W[facility] to [facility] [place], ph [phone]. Initial call taken by: Raechel Ache, RN,  August 24, 2009 8:34 AM  Follow-up for Phone Call        please refer Follow-up by: Gordy Savers  MD,  August 24, 2009 12:54 PM  Additional Follow-up for Phone Call Additional follow up Details #1::        information sent to terri . Additional Follow-up by: Duard Brady LPN,  August 25, 2009 12:14 PM

## 2010-08-07 NOTE — Progress Notes (Signed)
Summary: pain med    Caller: Patient Call For: Virginia Savers  MD Reason for Call: Talk to Doctor Summary of Call: left msg about pain med. Dr. Frederica Kuster was doing pain management for her before she went to the hospital - now out and forgot to ask about getting pain med. Please call  [phone] Initial call taken by: Duard Brady LPN,  March 06, 2010 2:27 PM    called and left detailed message on answering machine

## 2010-08-07 NOTE — Progress Notes (Signed)
Summary: REQ FOR REFILL (HYDROCODONE)    Caller: Patient  [phone] Reason for Call: Refill Medication Summary of Call: Pt called to req a refill on med: HYDROCODONE-ACETAMINOPHEN 7.5-500 MG TABS..... Pt adv that she is currently out of med and would like Rx for same.... Pt can be reached at [phone].  Pt was adv and acknowledged that Dr Kirtland Bouchard is out of the office but she wants to see if another physician can approve the refill Rx.  Initial call taken by: Debbra Riding,  October 13, 2009 8:46 AM  Follow-up for Phone Call        pt called today - to check on refill . Stated she was completely out . can be reached at  [phone] Follow-up by: Duard Brady LPN,  October 13, 2009 11:02 AM    Additional Follow-up for Phone Call Additional follow up Details #2::    please patient K. Dave for 100 tablets a month ago.  Also like to see rheumatologist recommended for pain????????????????? Follow-up by: Roderick Pee MD,  October 13, 2009 11:10 AM  Additional Follow-up for Phone Call Additional follow up Details #3:: Details for Additional Follow-up Action Taken: spoke with pt - per Dr. Tawanna Cooler no refill at this time - since Dr. Amador Cunas out of town  and med was just filled 1 mos ago. Pt understands and has appt with rheumatologist soon. I will inform Dr. Amador Cunas and she can check next week . KIK Additional Follow-up by: Duard Brady LPN,  October 13, 2009 12:22 PM  noted

## 2010-08-07 NOTE — Letter (Signed)
Summary: Rheumatology-Dr. Stacey Drain  Rheumatology-Dr. Stacey Drain   Imported By: Maryln Gottron 08/18/2009 10:25:07  _____________________________________________________________________  External Attachment:    Type:   Image     Comment:   External Document

## 2010-08-07 NOTE — Progress Notes (Signed)
Summary: needs pain meds  Phone Note Call from Patient   Caller: Patient Call For: Gordy Savers  MD Summary of Call: Rheumatology appt 01/30/2010 and they will not give her any meds.  Having severe arthritis pain. Needs pain meds. [facility]  [facility] or pain pills. [phone] Initial call taken by: Lynann Beaver CMA,  December 15, 2009 9:30 AM    Prescriptions: HYDROCODONE-ACETAMINOPHEN 7.5-500 MG TABS (HYDROCODONE-ACETAMINOPHEN) one every 8 hours as needed for pain  #50 x 0   Entered by:   Lynann Beaver CMA   Authorized by:   Gordy Savers  MD   Signed by:   Lynann Beaver CMA on 12/15/2009   Method used:   Telephoned to ...       [facility]  [place] [address]* (retail)       [address]       [place], [place]  [address]       Ph: [phone] or [phone]       Fax: [phone]   RxID:   [id number]  #50 notify that all further RF must be by rheumatology  Pt notified.

## 2010-09-09 ENCOUNTER — Inpatient Hospital Stay (HOSPITAL_COMMUNITY)
Admission: AD | Admit: 2010-09-09 | Discharge: 2010-09-09 | Disposition: A | Discharge status: Home / Self Care | Payer: Medicaid Other | Source: Ambulatory Visit | Attending: Obstetrics and Gynecology | Admitting: Obstetrics and Gynecology

## 2010-09-09 DIAGNOSIS — R109 Unspecified abdominal pain: Secondary | ICD-10-CM | POA: Insufficient documentation

## 2010-09-09 DIAGNOSIS — N739 Female pelvic inflammatory disease, unspecified: Secondary | ICD-10-CM | POA: Insufficient documentation

## 2010-09-09 LAB — URINALYSIS, ROUTINE W REFLEX MICROSCOPIC: Ketones, ur: 15 mg/dL — AB

## 2010-09-09 LAB — WET PREP, GENITAL
Trich, Wet Prep: NONE SEEN
Yeast Wet Prep HPF POC: NONE SEEN

## 2010-09-10 ENCOUNTER — Inpatient Hospital Stay (HOSPITAL_COMMUNITY)
Admission: AD | Admit: 2010-09-10 | Discharge: 2010-09-10 | Disposition: A | Discharge status: Home / Self Care | Payer: Medicaid Other | Source: Ambulatory Visit | Attending: Obstetrics and Gynecology | Admitting: Obstetrics and Gynecology

## 2010-09-10 DIAGNOSIS — N739 Female pelvic inflammatory disease, unspecified: Secondary | ICD-10-CM | POA: Insufficient documentation

## 2010-09-10 LAB — GC/CHLAMYDIA PROBE AMP, GENITAL: GC Probe Amp, Genital: NEGATIVE

## 2010-09-21 LAB — CBC
Hemoglobin: 10.9 g/dL — ABNORMAL LOW (ref 12.0–15.0)
MCH: 27.8 pg (ref 26.0–34.0)
MCHC: 33.8 g/dL (ref 30.0–36.0)
Platelets: 312 10*3/uL (ref 150–400)
RBC: 3.97 MIL/uL (ref 3.87–5.11)
WBC: 7.8 10*3/uL (ref 4.0–10.5)

## 2010-09-21 LAB — GC/CHLAMYDIA PROBE AMP, URINE: GC Probe Amp, Urine: NEGATIVE

## 2010-09-21 LAB — BASIC METABOLIC PANEL
CO2: 25 mEq/L (ref 19–32)
Calcium: 9.9 mg/dL (ref 8.4–10.5)
Creatinine, Ser: 0.75 mg/dL (ref 0.4–1.2)
GFR calc Af Amer: >60 mL/min (ref >60)
GFR calc non Af Amer: >60 mL/min (ref >60)
Glucose, Bld: 156 mg/dL — ABNORMAL HIGH (ref 70–99)
Sodium: 140 mEq/L (ref 135–145)

## 2010-09-21 LAB — CLOSTRIDIUM DIFFICILE EIA: C difficile Toxins A+B, EIA: NEGATIVE

## 2010-09-21 LAB — COMPREHENSIVE METABOLIC PANEL
ALT: 14 U/L (ref 0–35)
AST: 13 U/L (ref 0–37)
Albumin: 3.3 g/dL — ABNORMAL LOW (ref 3.5–5.2)
Alkaline Phosphatase: 56 U/L (ref 39–117)
CO2: 23 mEq/L (ref 19–32)
Chloride: 110 mEq/L (ref 96–112)
GFR calc Af Amer: >60 mL/min (ref >60)
GFR calc non Af Amer: >60 mL/min (ref >60)
Potassium: 4 mEq/L (ref 3.5–5.1)
Sodium: 139 mEq/L (ref 135–145)
Total Bilirubin: 0.4 mg/dL (ref 0.3–1.2)

## 2010-09-21 LAB — STOOL CULTURE

## 2010-09-21 LAB — URINALYSIS, ROUTINE W REFLEX MICROSCOPIC
Bilirubin Urine: NEGATIVE
Hgb urine dipstick: NEGATIVE
Ketones, ur: NEGATIVE mg/dL
Nitrite: NEGATIVE
Specific Gravity, Urine: 1.008 (ref 1.005–1.030)
Urobilinogen, UA: 0.2 mg/dL (ref 0.0–1.0)

## 2010-09-21 LAB — DIFFERENTIAL
Basophils Absolute: 0 10*3/uL (ref 0.0–0.1)
Basophils Absolute: 0.1 10*3/uL (ref 0.0–0.1)
Basophils Relative: 2 % — ABNORMAL HIGH (ref 0–1)
Eosinophils Absolute: 0.1 10*3/uL (ref 0.0–0.7)
Eosinophils Absolute: 0.1 10*3/uL (ref 0.0–0.7)
Eosinophils Relative: 2 % (ref 0–5)
Monocytes Absolute: 0.2 10*3/uL (ref 0.1–1.0)
Neutro Abs: 5.3 10*3/uL (ref 1.7–7.7)
Neutrophils Relative %: 74 % (ref 43–77)

## 2010-09-21 LAB — ANA: Anti Nuclear Antibody(ANA): NEGATIVE

## 2010-09-21 LAB — URINE CULTURE
Culture  Setup Time: 201108160115
Special Requests: NEGATIVE

## 2010-09-21 LAB — RHEUMATOID FACTOR: Rhuematoid fact SerPl-aCnc: <20 IU/mL (ref 0–20)

## 2010-09-22 LAB — BASIC METABOLIC PANEL
BUN: 2 mg/dL — ABNORMAL LOW (ref 6–23)
BUN: 21 mg/dL (ref 6–23)
BUN: 22 mg/dL (ref 6–23)
BUN: 3 mg/dL — ABNORMAL LOW (ref 6–23)
BUN: 4 mg/dL — ABNORMAL LOW (ref 6–23)
CO2: 25 mEq/L (ref 19–32)
CO2: 25 mEq/L (ref 19–32)
CO2: 26 mEq/L (ref 19–32)
CO2: 29 mEq/L (ref 19–32)
Calcium: 8.8 mg/dL (ref 8.4–10.5)
Calcium: 8.8 mg/dL (ref 8.4–10.5)
Calcium: 8.8 mg/dL (ref 8.4–10.5)
Calcium: 9 mg/dL (ref 8.4–10.5)
Calcium: 9.1 mg/dL (ref 8.4–10.5)
Calcium: 9.2 mg/dL (ref 8.4–10.5)
Chloride: 105 mEq/L (ref 96–112)
Chloride: 105 mEq/L (ref 96–112)
Chloride: 105 mEq/L (ref 96–112)
Chloride: 107 mEq/L (ref 96–112)
Chloride: 108 mEq/L (ref 96–112)
Creatinine, Ser: 0.63 mg/dL (ref 0.4–1.2)
Creatinine, Ser: 0.66 mg/dL (ref 0.4–1.2)
Creatinine, Ser: 0.77 mg/dL (ref 0.4–1.2)
Creatinine, Ser: 0.77 mg/dL (ref 0.4–1.2)
Creatinine, Ser: 0.82 mg/dL (ref 0.4–1.2)
GFR calc Af Amer: >60 mL/min (ref >60)
GFR calc Af Amer: >60 mL/min (ref >60)
GFR calc Af Amer: >60 mL/min (ref >60)
GFR calc Af Amer: >60 mL/min (ref >60)
GFR calc non Af Amer: >60 mL/min (ref >60)
GFR calc non Af Amer: >60 mL/min (ref >60)
GFR calc non Af Amer: >60 mL/min (ref >60)
Glucose, Bld: 101 mg/dL — ABNORMAL HIGH (ref 70–99)
Glucose, Bld: 105 mg/dL — ABNORMAL HIGH (ref 70–99)
Glucose, Bld: 106 mg/dL — ABNORMAL HIGH (ref 70–99)
Glucose, Bld: 106 mg/dL — ABNORMAL HIGH (ref 70–99)
Glucose, Bld: 109 mg/dL — ABNORMAL HIGH (ref 70–99)
Glucose, Bld: 141 mg/dL — ABNORMAL HIGH (ref 70–99)
Potassium: 3.5 mEq/L (ref 3.5–5.1)
Potassium: 3.8 mEq/L (ref 3.5–5.1)
Sodium: 138 mEq/L (ref 135–145)
Sodium: 139 mEq/L (ref 135–145)
Sodium: 139 mEq/L (ref 135–145)
Sodium: 141 mEq/L (ref 135–145)

## 2010-09-22 LAB — GRAM STAIN: Gram Stain: NONE SEEN

## 2010-09-22 LAB — URINALYSIS, ROUTINE W REFLEX MICROSCOPIC
Glucose, UA: NEGATIVE mg/dL
Hgb urine dipstick: NEGATIVE
Ketones, ur: >80 mg/dL — AB
pH: 7.5 (ref 5.0–8.0)

## 2010-09-22 LAB — TISSUE CULTURE: Culture: NO GROWTH

## 2010-09-22 LAB — URINE MICROSCOPIC-ADD ON

## 2010-09-22 LAB — CBC
HCT: 35.1 % — ABNORMAL LOW (ref 36.0–46.0)
Hemoglobin: 10.3 g/dL — ABNORMAL LOW (ref 12.0–15.0)
Hemoglobin: 10.3 g/dL — ABNORMAL LOW (ref 12.0–15.0)
Hemoglobin: 13.6 g/dL (ref 12.0–15.0)
Hemoglobin: 14.2 g/dL (ref 12.0–15.0)
Hemoglobin: 9.4 g/dL — ABNORMAL LOW (ref 12.0–15.0)
MCH: 27.2 pg (ref 26.0–34.0)
MCH: 27.3 pg (ref 26.0–34.0)
MCHC: 33 g/dL (ref 30.0–36.0)
MCHC: 33.2 g/dL (ref 30.0–36.0)
MCHC: 33.4 g/dL (ref 30.0–36.0)
MCHC: 33.5 g/dL (ref 30.0–36.0)
MCHC: 33.6 g/dL (ref 30.0–36.0)
MCV: 81.2 fL (ref 78.0–100.0)
MCV: 81.7 fL (ref 78.0–100.0)
Platelets: 288 10*3/uL (ref 150–400)
Platelets: 392 10*3/uL (ref 150–400)
RBC: 3.46 MIL/uL — ABNORMAL LOW (ref 3.87–5.11)
RBC: 3.76 MIL/uL — ABNORMAL LOW (ref 3.87–5.11)
RBC: 3.79 MIL/uL — ABNORMAL LOW (ref 3.87–5.11)
RBC: 5.2 MIL/uL — ABNORMAL HIGH (ref 3.87–5.11)
RDW: 14.5 % (ref 11.5–15.5)
RDW: 15.3 % (ref 11.5–15.5)
RDW: 15.4 % (ref 11.5–15.5)
WBC: 10.8 10*3/uL — ABNORMAL HIGH (ref 4.0–10.5)
WBC: 11.3 10*3/uL — ABNORMAL HIGH (ref 4.0–10.5)

## 2010-09-22 LAB — DIFFERENTIAL
Basophils Absolute: 0 10*3/uL (ref 0.0–0.1)
Eosinophils Absolute: 0 10*3/uL (ref 0.0–0.7)
Eosinophils Absolute: 0.1 10*3/uL (ref 0.0–0.7)
Eosinophils Relative: 0 % (ref 0–5)
Eosinophils Relative: 1 % (ref 0–5)
Lymphocytes Relative: 10 % — ABNORMAL LOW (ref 12–46)
Lymphs Abs: 1.9 10*3/uL (ref 0.7–4.0)
Monocytes Absolute: 0.7 10*3/uL (ref 0.1–1.0)
Monocytes Relative: 4 % (ref 3–12)

## 2010-09-22 LAB — COMPREHENSIVE METABOLIC PANEL
ALT: 10 U/L (ref 0–35)
ALT: 11 U/L (ref 0–35)
AST: 12 U/L (ref 0–37)
AST: 18 U/L (ref 0–37)
Albumin: 4.3 g/dL (ref 3.5–5.2)
CO2: 21 mEq/L (ref 19–32)
Calcium: 11 mg/dL — ABNORMAL HIGH (ref 8.4–10.5)
Chloride: 98 mEq/L (ref 96–112)
Creatinine, Ser: 1.52 mg/dL — ABNORMAL HIGH (ref 0.4–1.2)
GFR calc Af Amer: 49 mL/min — ABNORMAL LOW (ref >60)
GFR calc Af Amer: >60 mL/min (ref >60)
Potassium: 4 mEq/L (ref 3.5–5.1)
Potassium: 4.1 mEq/L (ref 3.5–5.1)
Sodium: 139 mEq/L (ref 135–145)
Sodium: 146 mEq/L — ABNORMAL HIGH (ref 135–145)
Total Bilirubin: 0.7 mg/dL (ref 0.3–1.2)
Total Protein: 9.8 g/dL — ABNORMAL HIGH (ref 6.0–8.3)

## 2010-09-22 LAB — ANAEROBIC CULTURE

## 2010-09-22 LAB — FUNGUS CULTURE W SMEAR

## 2010-09-22 LAB — MAGNESIUM
Magnesium: 2.2 mg/dL (ref 1.5–2.5)
Magnesium: 2.3 mg/dL (ref 1.5–2.5)

## 2010-10-01 LAB — RAPID URINE DRUG SCREEN, HOSP PERFORMED
Amphetamines: NOT DETECTED
Benzodiazepines: POSITIVE — AB
Tetrahydrocannabinol: NOT DETECTED

## 2010-10-01 LAB — DIFFERENTIAL
Eosinophils Absolute: 0.1 10*3/uL (ref 0.0–0.7)
Lymphs Abs: 2.3 10*3/uL (ref 0.7–4.0)
Neutrophils Relative %: 65 % (ref 43–77)

## 2010-10-01 LAB — ETHANOL: Alcohol, Ethyl (B): <5 mg/dL (ref 0–10)

## 2010-10-01 LAB — BASIC METABOLIC PANEL
BUN: 11 mg/dL (ref 6–23)
Creatinine, Ser: 0.86 mg/dL (ref 0.4–1.2)
GFR calc non Af Amer: >60 mL/min (ref >60)

## 2010-10-01 LAB — POCT PREGNANCY, URINE: Preg Test, Ur: NEGATIVE

## 2010-10-01 LAB — CBC
MCV: 87.6 fL (ref 78.0–100.0)
Platelets: 318 10*3/uL (ref 150–400)
WBC: 8.3 10*3/uL (ref 4.0–10.5)

## 2010-10-01 LAB — TRICYCLICS SCREEN, URINE: TCA Scrn: NOT DETECTED

## 2010-10-09 ENCOUNTER — Emergency Department (HOSPITAL_BASED_OUTPATIENT_CLINIC_OR_DEPARTMENT_OTHER)
Admission: EM | Admit: 2010-10-09 | Discharge: 2010-10-09 | Disposition: A | Discharge status: Home / Self Care | Payer: Medicaid Other | Attending: Emergency Medicine | Admitting: Emergency Medicine

## 2010-10-09 ENCOUNTER — Emergency Department (INDEPENDENT_AMBULATORY_CARE_PROVIDER_SITE_OTHER): Payer: Medicaid Other

## 2010-10-09 DIAGNOSIS — F17200 Nicotine dependence, unspecified, uncomplicated: Secondary | ICD-10-CM | POA: Insufficient documentation

## 2010-10-09 DIAGNOSIS — N898 Other specified noninflammatory disorders of vagina: Secondary | ICD-10-CM

## 2010-10-09 DIAGNOSIS — R109 Unspecified abdominal pain: Secondary | ICD-10-CM

## 2010-10-09 LAB — BASIC METABOLIC PANEL
BUN: 11 mg/dL (ref 6–23)
CO2: 24 mEq/L (ref 19–32)
Calcium: 9.3 mg/dL (ref 8.4–10.5)
Creatinine, Ser: 0.8 mg/dL (ref 0.4–1.2)

## 2010-10-09 LAB — PREGNANCY, URINE: Preg Test, Ur: NEGATIVE

## 2010-10-09 LAB — URINALYSIS, ROUTINE W REFLEX MICROSCOPIC
Bilirubin Urine: NEGATIVE
Ketones, ur: 15 mg/dL — AB
Nitrite: NEGATIVE
Protein, ur: NEGATIVE mg/dL
Specific Gravity, Urine: 1.025 (ref 1.005–1.030)
Urobilinogen, UA: 1 mg/dL (ref 0.0–1.0)

## 2010-10-09 LAB — DIFFERENTIAL
Basophils Absolute: 0 10*3/uL (ref 0.0–0.1)
Eosinophils Relative: 5 % (ref 0–5)
Lymphocytes Relative: 34 % (ref 12–46)
Neutrophils Relative %: 55 % (ref 43–77)

## 2010-10-09 LAB — URINE MICROSCOPIC-ADD ON

## 2010-10-09 LAB — CBC
HCT: 35.8 % — ABNORMAL LOW (ref 36.0–46.0)
Platelets: 243 10*3/uL (ref 150–400)
RBC: 4.35 MIL/uL (ref 3.87–5.11)
RDW: 15.6 % — ABNORMAL HIGH (ref 11.5–15.5)
WBC: 5.5 10*3/uL (ref 4.0–10.5)

## 2010-10-13 LAB — CBC
MCHC: 34.5 g/dL (ref 30.0–36.0)
MCV: 91.5 fL (ref 78.0–100.0)
Platelets: 230 10*3/uL (ref 150–400)
RBC: 3.69 MIL/uL — ABNORMAL LOW (ref 3.87–5.11)
RDW: 13.4 % (ref 11.5–15.5)

## 2010-10-13 LAB — DIFFERENTIAL
Eosinophils Absolute: 0.1 10*3/uL (ref 0.0–0.7)
Eosinophils Relative: 1 % (ref 0–5)
Lymphocytes Relative: 18 % (ref 12–46)
Lymphs Abs: 1.3 10*3/uL (ref 0.7–4.0)
Monocytes Absolute: 0.4 10*3/uL (ref 0.1–1.0)
Monocytes Relative: 6 % (ref 3–12)

## 2010-10-13 LAB — COMPREHENSIVE METABOLIC PANEL
ALT: 15 U/L (ref 0–35)
AST: 14 U/L (ref 0–37)
Albumin: 3.4 g/dL — ABNORMAL LOW (ref 3.5–5.2)
CO2: 25 mEq/L (ref 19–32)
Calcium: 9.1 mg/dL (ref 8.4–10.5)
Creatinine, Ser: 0.8 mg/dL (ref 0.4–1.2)
GFR calc Af Amer: >60 mL/min (ref >60)
Sodium: 137 mEq/L (ref 135–145)
Total Protein: 6.4 g/dL (ref 6.0–8.3)

## 2010-11-12 ENCOUNTER — Emergency Department (HOSPITAL_COMMUNITY): Payer: Medicaid Other

## 2010-11-12 ENCOUNTER — Inpatient Hospital Stay (HOSPITAL_COMMUNITY)
Admission: EM | Admit: 2010-11-12 | Discharge: 2010-11-16 | DRG: 546 | Disposition: A | Discharge status: Home / Self Care | Payer: Medicaid Other | Attending: Internal Medicine | Admitting: Internal Medicine

## 2010-11-12 DIAGNOSIS — M329 Systemic lupus erythematosus, unspecified: Principal | ICD-10-CM | POA: Diagnosis present

## 2010-11-12 DIAGNOSIS — I959 Hypotension, unspecified: Secondary | ICD-10-CM | POA: Diagnosis present

## 2010-11-12 DIAGNOSIS — D61818 Other pancytopenia: Secondary | ICD-10-CM | POA: Diagnosis present

## 2010-11-12 DIAGNOSIS — E860 Dehydration: Secondary | ICD-10-CM | POA: Diagnosis present

## 2010-11-12 DIAGNOSIS — Z8742 Personal history of other diseases of the female genital tract: Secondary | ICD-10-CM

## 2010-11-12 DIAGNOSIS — IMO0001 Reserved for inherently not codable concepts without codable children: Secondary | ICD-10-CM | POA: Diagnosis present

## 2010-11-12 DIAGNOSIS — M069 Rheumatoid arthritis, unspecified: Secondary | ICD-10-CM | POA: Diagnosis present

## 2010-11-12 DIAGNOSIS — F17200 Nicotine dependence, unspecified, uncomplicated: Secondary | ICD-10-CM | POA: Diagnosis present

## 2010-11-12 DIAGNOSIS — R5081 Fever presenting with conditions classified elsewhere: Secondary | ICD-10-CM | POA: Diagnosis present

## 2010-11-12 DIAGNOSIS — D709 Neutropenia, unspecified: Secondary | ICD-10-CM | POA: Diagnosis present

## 2010-11-12 LAB — CSF CELL COUNT WITH DIFFERENTIAL

## 2010-11-12 LAB — DIFFERENTIAL
Band Neutrophils: 0 % (ref 0–10)
Basophils Absolute: 0 10*3/uL (ref 0.0–0.1)
Basophils Relative: 2 % — ABNORMAL HIGH (ref 0–1)
Eosinophils Absolute: 0 10*3/uL (ref 0.0–0.7)
Lymphocytes Relative: 23 % (ref 12–46)
Lymphs Abs: 0.3 10*3/uL — ABNORMAL LOW (ref 0.7–4.0)
Metamyelocytes Relative: 0 %
Monocytes Absolute: 0.1 10*3/uL (ref 0.1–1.0)
Monocytes Relative: 5 % (ref 3–12)

## 2010-11-12 LAB — PROTEIN, CSF: Total  Protein, CSF: 33 mg/dL (ref 15–45)

## 2010-11-12 LAB — SEDIMENTATION RATE: Sed Rate: 44 mm/hr — ABNORMAL HIGH (ref 0–22)

## 2010-11-12 LAB — COMPREHENSIVE METABOLIC PANEL
AST: 23 U/L (ref 0–37)
Albumin: 3.5 g/dL (ref 3.5–5.2)
BUN: 8 mg/dL (ref 6–23)
Chloride: 100 mEq/L (ref 96–112)
Creatinine, Ser: 0.84 mg/dL (ref 0.4–1.2)
GFR calc Af Amer: >60 mL/min (ref >60)
Potassium: 3.8 mEq/L (ref 3.5–5.1)
Total Protein: 7 g/dL (ref 6.0–8.3)

## 2010-11-12 LAB — URINALYSIS, ROUTINE W REFLEX MICROSCOPIC
Bilirubin Urine: NEGATIVE
Glucose, UA: NEGATIVE mg/dL
Ketones, ur: NEGATIVE mg/dL
Nitrite: NEGATIVE
Specific Gravity, Urine: 1.009 (ref 1.005–1.030)
pH: 6 (ref 5.0–8.0)

## 2010-11-12 LAB — LACTIC ACID, PLASMA: Lactic Acid, Venous: 1 mmol/L (ref 0.5–2.2)

## 2010-11-12 LAB — RAPID STREP SCREEN (MED CTR MEBANE ONLY): Streptococcus, Group A Screen (Direct): NEGATIVE

## 2010-11-12 LAB — CBC
MCH: 28.8 pg (ref 26.0–34.0)
MCHC: 33.9 g/dL (ref 30.0–36.0)
Platelets: 175 10*3/uL (ref 150–400)
RDW: 15.9 % — ABNORMAL HIGH (ref 11.5–15.5)

## 2010-11-12 LAB — GLUCOSE, CSF: Glucose, CSF: 64 mg/dL (ref 43–76)

## 2010-11-12 LAB — PROTIME-INR: INR: 0.96 (ref 0.00–1.49)

## 2010-11-12 LAB — POCT PREGNANCY, URINE: Preg Test, Ur: NEGATIVE

## 2010-11-13 LAB — CBC
Hemoglobin: 9.3 g/dL — ABNORMAL LOW (ref 12.0–15.0)
MCH: 27.6 pg (ref 26.0–34.0)
MCV: 85.2 fL (ref 78.0–100.0)
RBC: 3.37 MIL/uL — ABNORMAL LOW (ref 3.87–5.11)

## 2010-11-13 LAB — COMPREHENSIVE METABOLIC PANEL
Albumin: 2.6 g/dL — ABNORMAL LOW (ref 3.5–5.2)
Alkaline Phosphatase: 53 U/L (ref 39–117)
BUN: 6 mg/dL (ref 6–23)
Calcium: 8.4 mg/dL (ref 8.4–10.5)
Creatinine, Ser: 0.62 mg/dL (ref 0.4–1.2)
Potassium: 3.8 mEq/L (ref 3.5–5.1)
Total Protein: 5.5 g/dL — ABNORMAL LOW (ref 6.0–8.3)

## 2010-11-13 LAB — DIFFERENTIAL
Basophils Relative: 1 % (ref 0–1)
Eosinophils Relative: 8 % — ABNORMAL HIGH (ref 0–5)
Monocytes Absolute: 0.1 10*3/uL (ref 0.1–1.0)
Neutro Abs: 0.4 10*3/uL — ABNORMAL LOW (ref 1.7–7.7)
Neutrophils Relative %: 23 % — ABNORMAL LOW (ref 43–77)

## 2010-11-13 LAB — MRSA PCR SCREENING: MRSA by PCR: NEGATIVE

## 2010-11-13 LAB — PATHOLOGIST SMEAR REVIEW

## 2010-11-13 LAB — PROCALCITONIN: Procalcitonin: <0.1 ng/mL

## 2010-11-14 LAB — DIFFERENTIAL
Basophils Absolute: 0 10*3/uL (ref 0.0–0.1)
Eosinophils Absolute: 0.3 10*3/uL (ref 0.0–0.7)
Lymphocytes Relative: 47 % — ABNORMAL HIGH (ref 12–46)
Monocytes Absolute: 0.1 10*3/uL (ref 0.1–1.0)
Neutrophils Relative %: 38 % — ABNORMAL LOW (ref 43–77)

## 2010-11-14 LAB — BASIC METABOLIC PANEL
Chloride: 107 mEq/L (ref 96–112)
Creatinine, Ser: 0.59 mg/dL (ref 0.4–1.2)
GFR calc Af Amer: >60 mL/min (ref >60)
GFR calc non Af Amer: >60 mL/min (ref >60)
Potassium: 3.9 mEq/L (ref 3.5–5.1)

## 2010-11-14 LAB — CBC
Platelets: 118 10*3/uL — ABNORMAL LOW (ref 150–400)
RBC: 3.36 MIL/uL — ABNORMAL LOW (ref 3.87–5.11)
RDW: 16.4 % — ABNORMAL HIGH (ref 11.5–15.5)
WBC: 2.9 10*3/uL — ABNORMAL LOW (ref 4.0–10.5)

## 2010-11-14 LAB — COMPLEMENT, TOTAL: Compl, Total (CH50): 49 U/mL (ref 31–60)

## 2010-11-14 LAB — VANCOMYCIN, TROUGH: Vancomycin Tr: 13.8 ug/mL (ref 10.0–20.0)

## 2010-11-15 LAB — CBC
Hemoglobin: 8.8 g/dL — ABNORMAL LOW (ref 12.0–15.0)
MCH: 27.8 pg (ref 26.0–34.0)
MCHC: 32.8 g/dL (ref 30.0–36.0)
Platelets: 121 10*3/uL — ABNORMAL LOW (ref 150–400)
RBC: 3.17 MIL/uL — ABNORMAL LOW (ref 3.87–5.11)

## 2010-11-15 LAB — BASIC METABOLIC PANEL
CO2: 23 mEq/L (ref 19–32)
Calcium: 8.7 mg/dL (ref 8.4–10.5)
Creatinine, Ser: 0.52 mg/dL (ref 0.4–1.2)
GFR calc Af Amer: >60 mL/min (ref >60)
Sodium: 136 mEq/L (ref 135–145)

## 2010-11-16 LAB — DIFFERENTIAL
Basophils Absolute: 0 10*3/uL (ref 0.0–0.1)
Eosinophils Absolute: 0 10*3/uL (ref 0.0–0.7)
Lymphocytes Relative: 31 % (ref 12–46)
Monocytes Relative: 6 % (ref 3–12)
Neutro Abs: 2.3 10*3/uL (ref 1.7–7.7)
Neutrophils Relative %: 62 % (ref 43–77)

## 2010-11-16 LAB — CBC
MCV: 83.9 fL (ref 78.0–100.0)
Platelets: 147 10*3/uL — ABNORMAL LOW (ref 150–400)
RBC: 3.04 MIL/uL — ABNORMAL LOW (ref 3.87–5.11)
RDW: 15.8 % — ABNORMAL HIGH (ref 11.5–15.5)
WBC: 3.6 10*3/uL — ABNORMAL LOW (ref 4.0–10.5)

## 2010-11-16 LAB — CSF CULTURE W GRAM STAIN
Culture: NO GROWTH
Gram Stain: NONE SEEN

## 2010-11-18 LAB — CULTURE, BLOOD (ROUTINE X 2)
Culture  Setup Time: 201205072358
Culture: NO GROWTH

## 2010-11-19 ENCOUNTER — Ambulatory Visit: Payer: Medicaid Other | Admitting: Infectious Disease

## 2010-11-23 NOTE — Op Note (Signed)
[name], [name]               ACCOUNT NO.:  [id number]   MEDICAL RECORD NO.:  [id number]          PATIENT TYPE:  AMB   LOCATION:  DSC                          FACILITY:  [facility]   PHYSICIAN:  Jefry H. Pollyann Kennedy, MD     DATE OF BIRTH:  [birth year]   DATE OF PROCEDURE:  04/29/2005  DATE OF DISCHARGE:                                 OPERATIVE REPORT   PREOPERATIVE DIAGNOSIS:  1.  Nasal septal deviation.  2.  Bilateral inferior turbinate hypertrophy.   POSTOPERATIVE DIAGNOSIS:  1.  Nasal septal deviation.  2.  Bilateral inferior turbinate hypertrophy.   PROCEDURES:  1.  Nasal septoplasty.  2.  Submucosal resection of inferior turbinates bilaterally.   SURGEON:  Serena Colonel, M.D.   ANESTHESIA:  General endotracheal anesthesia.   COMPLICATIONS:  No complications.   ESTIMATED BLOOD LOSS:  Fifty mL.   FINDINGS:  Severe leftward septal deviation with thickening and angulation  of the upper quadrangular cartilage and ethmoid plate towards the left side.  Significant bony hypertrophy of the inferior turbinates bilaterally.   REFERRING PHYSICIAN:  Madelin Rear. Sherwood Gambler, M.D.   HISTORY:  A 30 year old with a history of chronic nasal obstruction and  recurring nosebleeds. Risks, benefits, alternatives, and complications of  the procedure were explained to the patient who seemed to understand and  agreed to surgery.   PROCEDURE IN DETAIL:  The patient was taken to the operating room, placed on  the operating table in the supine position. Following induction of general  endotracheal anesthesia, the patient was draped in a standard fashion.  Oxymetazoline spray was used preoperatively in the nasal cavities. Xylocaine  1% with epinephrine was infiltrated into the septum, the columella, and  inferior turbinates bilaterally. Afrin-soaked pledgets were placed  bilaterally.   Procedure #1. Nasal septoplasty. A left hemitransfixion incision was created  with a #15 scalpel and used to elevate a  mucosal flap down the left side.  Bony cartilaginous junction was divided and a similar flap was developed  down the right side. The superior attachment of the ethmoid plate was taken  down using a Laren Boom rongeur. Posterior and inferior attachments  were taken down as well with a Therapist, nutritional and large fragments of septal  bone and cartilage were then resected. The posterior aspect of the  quadrangular cartilage was hypertrophied and was resected as well leaving  sufficient dorsal strut remaining. The maxillary crest was deflected towards  the left and was partially removed using the [facility] rongeur as  well. The septal incision was reapproximated with a 3-0 chromic suture and  the septal flaps were quilted with plain gut suture.   Procedure #2. Submucosal resection of inferior turbinates. The leading edge  of the inferior turbinates was incised in a vertical fashion with a #15  scalpel. A Freer elevator was used to elevate mucosa off the bone in all  directions. Large fragments of bone were resected using Takahashi forceps.  Turbinate remnants were outfractured with a Therapist, nutritional. The nasal  cavities were suctioned of blood and secretions and packed with rolled up  Telfa  coated with Bacitracin. The pharynx was then suctioned of blood as  well and the patient was awakened, extubated, and transferred to recovery.      Jefry H. Pollyann Kennedy, MD  Electronically Signed     JHR/MEDQ  D:  04/29/2005  T:  04/29/2005  Job:  [id number]   cc:   Madelin Rear. Sherwood Gambler, MD  Fax: [phone]

## 2010-11-23 NOTE — Discharge Summary (Signed)
[name], [name]               ACCOUNT NO.:  [id number]   MEDICAL RECORD NO.:  [id number]          PATIENT TYPE:  INP   LOCATION:  9112                          FACILITY:  [facility]   PHYSICIAN:  Zenaida Niece, M.D.DATE OF BIRTH:  [birth year]   DATE OF ADMISSION:  09/01/2006  DATE OF DISCHARGE:                               DISCHARGE SUMMARY   ADMISSION DIAGNOSES:  1. Intrauterine pregnancy at 39 weeks.  2. Group B streptococcus carrier.   DISCHARGE DIAGNOSES:  1. Intrauterine pregnancy at 39 weeks.  2. Group B streptococcus carrier.   PROCEDURES:  On September 01, 2006, she had a spontaneous vaginal  delivery.   HISTORY AND PHYSICAL:  This is a 30 year old white female gravida 6,  para 1-0-4-1 with an EGA of 39+ weeks who presents for elective  induction.  Prenatal care complicated by the fact that she is a smoker;  she complained of headache, back and leg pain and leg pain treated with  Darvocet and Vicodin and is miserable.  She did see an orthopedist  during this pregnancy and had a cortisone shot for right hip bursitis.  She has also had depression treated with Wellbutrin but then changed to  Zoloft for anxiety.  Prenatal labs:  Blood type is A positive with a  negative antibody screen, RPR nonreactive, rubella immune, hepatitis B  surface antigen negative, HIV negative, gonorrhea and chlamydia  negative, first trimester screen is normal, AFP is normal, 1-hour  Glucola is 96, and group B strep is positive.  Past OB history:  One  vaginal delivery at term, 38 weeks, 7 pounds 12 ounces, no  complications.  She said one spontaneous abortion and three elective  terminations.  Past medical history:  Anxiety, depression and right hip  bursitis.  Past surgical history:  Nasal septoplasty.  Physical exam:  She is afebrile with stable vital signs.  Fetal heart tracing is  reactive with occasional contractions.  Abdomen is gravid, nontender,  with an estimated fetal weight of 8  pounds.  Cervix on admission is 2-3,  60, -2, vertex presentation, adequate pelvis, and amniotomy reveals  clear to possibly light meconium.   HOSPITAL COURSE:  The patient was admitted and had amniotomy performed  and was put on penicillin for group B strep prophylaxis.  She then  required Pitocin to enter active labor.  She received several doses of  Nubain and then eventually an epidural.  She entered labor, progressed  to complete, pushed well, and on the evening of February 25 had a  vaginal delivery of a viable female infant with Apgars of 8 and 9 that  weighed 9 pounds 2 ounces.  Placenta delivered spontaneous, was intact,  and was sent for cord blood collection.  Perineum was intact.  She did  have a small cervical laceration at 12 o'clock repaired with 3-0 Vicryl  and estimated blood loss was 600 mL.  Postpartum she had no significant  complications.  Predelivery hemoglobin 11, postdelivery 9.1, and on post  partum day #2 she was felt to be stable enough for discharge home.   DISCHARGE INSTRUCTIONS:  Regular diet, pelvic rest, followup in 6 weeks.  Medications are Percocet #30 one to two p.o. every 4-6 hours p.r.n. pain  and over-the-counter ibuprofen as needed, and she is given our discharge  pamphlet.      Zenaida Niece, M.D.  Electronically Signed     TDM/MEDQ  D:  09/03/2006  T:  09/03/2006  Job:  [id number]

## 2010-11-28 NOTE — Discharge Summary (Signed)
[name], [name]               ACCOUNT NO.:  [id number]  MEDICAL RECORD NO.:  [id number]           PATIENT TYPE:  I  LOCATION:  1404                         [facility]ACILITY:  [facility]  PHYSICIAN:  Rosanna Randy, MDDATE OF BIRTH:  October 28, 1980  DATE OF ADMISSION:  11/12/2010 DATE OF DISCHARGE:  11/16/2010                              DISCHARGE SUMMARY   PRIMARY CARE PHYSICIAN:  Dr. Candie Echevaria.  RHEUMATOLOGIST:  Dr. Ancil Linsey in [place].  DISCHARGE DIAGNOSES: 1. Lupus without lupus flare up during this hospitalization. 2. Febrile neutropenia. 3. Pancytopenia. 4. Tobacco abuse. 5. History of PID. 6. Partial small-bowel obstruction, status post laparoscopic lysis of     adhesions by Dr. Andrey Campanile in January 31, 2010. 7. Nasal septoplasty and submucosal resection of the inferior     turbinates bilaterally by Dr. Serena Colonel in April 29, 2005. 8. History of rheumatoid arthritis.  DISCHARGE MEDICATIONS: 1. Tylenol 325 mg tablets, to take 1 to 2 tablets by mouth every 4     hours as needed for pain. 2. Famotidine 40 mg 1 tablet by mouth daily. 3. Prednisone 20 mg tablet, patient had been instructed to take 2     tablets by mouth for 15 days and then 1 tablet by mouth daily. 4. Vicodin 5/500, one to two tablets by mouth every 8 hours as needed     for severe pain that is not relieved by Tylenol.  DISPOSITION AND FOLLOWUP:  The patient had been discharged in a stable and improved condition.  Currently, not having significant stiffness or swelling on her joints and also with an improved count number of her platelets and white blood cells.  She had been advised and instructed to schedule a followup appointment with rheumatologist, Dr. Ancil Linsey at [facility] over the next 5 days for further adjustment and treatment of her lupus.  The patient was also instructed to arrange a followup appointment with Dr. Candie Echevaria, primary care physician over the next 5 to 7 days in order to continue  providing treatment of her chronic medical problems.  The patient was instructed to stop smoking and has received counseling and advised for smoking cessation.  At this point, patient also received instructions to keep herself away from the sun and in case that she need to be exposed, to use sun blocker and to cover herself in order to protect her skin due to her conditions of lupus.  PROCEDURES PERFORMED:  The patient had a chest x-ray on Nov 12, 2010 that demonstrated normal heart and mediastinal contours.  Lungs were clear. There were no pleural effusions.  Osseous structures and soft tissues were unremarkable.  She also had a CT of the head without contrast on Nov 12, 2010 that demonstrated no acute intracranial abnormalities.  No other procedures were performed during this hospitalization.  CONSULTATIONS:  No consultations were made.  HISTORY OF PRESENT ILLNESS:  The patient is a 30 year old female with a history of rheumatoid arthritis and a newly-diagnosed lupus, also with history of pelvic inflammatory disease in the past, apparently complaining of subjective fever to 101.8.  The patient was also felt just  generally horrible.  She complained of some headaches, so received a lumbar puncture in the ED which was negative for meningitis.  The patient denies any cough, chest pain, palpitation, shortness of breath, nausea, vomiting, diarrhea, dysuria, or hematuria.  She had a generalized arthralgias and also arthritis and stiffness.  The patient denies any rashes on her skin recently, but reports having on and off rashes and also skin lesions.  A chest x-ray failed to show any evidence of pneumonia.  The patient had a CT of the brain which was negative for any subacute or acute process.  Urinalysis was negative for UTI.  A urine pregnancy test was also negative.  The patient had a rapid strep screen of her throat which was negative.  The patient was admitted for further workup of her  fever and also for further workup of  a low platelets and low white blood cells found on the admission labs.  For further details on her history of present illness, please reports to dictation done by Dr. Randel Pigg on Nov 12, 2010.  Of note, patient was recently started on Bactrim double-strength tablets by mouth twice a day and methotrexate 2.5 mg by mouth daily on Monday, Wednesday, Friday. The patient reports that she stopped taking both medications about 1-1/2 week prior to admission.  LABORATORY DATA:  Pertinent laboratory data throughout this hospitalization includes negative urinalysis.  Negative urine pregnancy test.  The patient had a PT of 13.0, INR 0.96, lactic acid was 1.0. Comprehensive metabolic panel showed a sodium of 133, potassium 3.8, chloride 100, bicarb 21, blood sugar of 109, BUN 8, creatinine 0.84. Liver function test was unremarkable.  Rapid strep screen test was negative.  CBC done on admission demonstrated a white blood cells of 1.4 with hemoglobin of 11, platelets 175,000 on admission.  Glucose of the CSF after a lumbar puncture done was 64, total protein 33.  Differential cell count was colorless, clarity was clear, 2 white blood cells, no red blood cells, ESR was 44, CRP was 1.0, C3 was 72, C4 was 13, LDH was 161, procalcitonin level was less than 10.  A followup CBC on Nov 13, 2010 demonstrated a platelets of 140,000, white blood cells 1.8, and hemoglobin of 9.3.  Blood cultures negative rest.  MRSA PCR was negative.  CH50 was 49.  CBC on Nov 14, 2010 demonstrated a white blood cells of 2.9, platelets 118,000, hemoglobin 8.8.  HOSPITAL COURSE: 1. Neutropenia and fever with also consumption of her C3-C4 complement     levels, all secondary to lupus flare.  Initially, patient was cover     due to the concern of neutropenic fever with antibiotics through     her pain using vancomycin and cefepime.  Infections sources were     unable to be identified.  Antibiotics  were discontinued.  The     patient remains afebrile for the rest of her hospitalization.  She     received a total 3 days of antibiotics.  At that moment, the     patient's symptoms and all further blood work done, suggested that     she was presenting with lupus flare causing pancytopenia, myositis,     joint inflammation especially around her hands, decrease complement     levels, especially C3 and C4, and she was started on steroids.  She     received 2 days of IV steroids with a transition to p.o. steroids,     and having discharge instructions to follow  with her primary care     physician and also rheumatologist.  The patient's symptoms     dramatically improved after she was placed on steroids, indicated     that is most likely secondary to her lupus condition. 2. The patient's lupus.  At this point, stable and responded to the     use of steroids.  The patient is going to continue taking her     medications as prescribed and is going to follow with her     rheumatologist for further adjustment and further treatment of her     condition. 3. For the patient tobacco abuse, she received smoking cessation and     counseling but in this hospitalization, was placed on nicotine     patch. 4. GI prophylaxis.  Due to the use of steroids, patient was placed on     Pepcid daily in order to help protecting her stomach for the use of     prednisone. 5. The patient's low white blood cells were initially thought to be     secondary to methotrexate and Bactrim use.  These 2 medications     have been placed on hold until she follow with her rheumatologist.  DISCHARGE PHYSICAL EXAMINATION:  VITAL SIGNS:  At discharge patient's vital signs demonstrated a temperature of 97.5, heart rate of 75, respiratory rate 14, blood pressure 114/67, oxygen saturation 100% on room air. GENERAL:  The patient was generally in no acute distress. RESPIRATORY SYSTEM:  Clear to auscultation bilaterally. HEART:   Regular rate and rhythm.  No murmurs, gallops, or rubs. ABDOMEN:  Soft, nontender.  Positive bowel sounds.  No distention. EXTREMITIES:  No edema, cyanosis, or clubbing were appreciated. SKIN:  At this moment, there were no rashes on her skin. NEUROLOGIC EXAM:  Alert, awake, oriented x3.  Grossly intact.  The patient's blood work at discharge demonstrated a white blood cells of 3.6 with a hemoglobin of 8.5, and a platelet count of 147,000.  The patient's absolute neutrophil was 2.3.     Rosanna Randy, MD     CEM/MEDQ  D:  11/16/2010  T:  11/16/2010  Job:  [id number]  cc:   Dr. Candie Echevaria  Dr. Aaron Edelman [facility]  Electronically Signed by Vassie Loll MD on 11/28/2010 10:23:55 PM

## 2010-12-15 ENCOUNTER — Emergency Department (HOSPITAL_BASED_OUTPATIENT_CLINIC_OR_DEPARTMENT_OTHER)
Admission: EM | Admit: 2010-12-15 | Discharge: 2010-12-15 | Disposition: A | Discharge status: Home / Self Care | Payer: Medicaid Other | Attending: Emergency Medicine | Admitting: Emergency Medicine

## 2010-12-15 ENCOUNTER — Emergency Department (HOSPITAL_BASED_OUTPATIENT_CLINIC_OR_DEPARTMENT_OTHER)
Admission: EM | Admit: 2010-12-15 | Discharge: 2010-12-16 | Disposition: A | Discharge status: Home / Self Care | Payer: Medicaid Other

## 2010-12-15 DIAGNOSIS — H6690 Otitis media, unspecified, unspecified ear: Secondary | ICD-10-CM | POA: Insufficient documentation

## 2010-12-15 DIAGNOSIS — J029 Acute pharyngitis, unspecified: Secondary | ICD-10-CM | POA: Insufficient documentation

## 2010-12-15 DIAGNOSIS — Z8739 Personal history of other diseases of the musculoskeletal system and connective tissue: Secondary | ICD-10-CM | POA: Insufficient documentation

## 2010-12-15 LAB — RAPID STREP SCREEN (MED CTR MEBANE ONLY): Streptococcus, Group A Screen (Direct): NEGATIVE

## 2010-12-16 NOTE — H&P (Signed)
[name], [name]               ACCOUNT NO.:  [id number]  MEDICAL RECORD NO.:  [id number]           PATIENT TYPE:  E  LOCATION:  WLED                         FACILITY:  [facility]  PHYSICIAN:  Massie Maroon, MD        DATE OF BIRTH:  1981-05-14  DATE OF ADMISSION:  11/12/2010 DATE OF DISCHARGE:                             HISTORY & PHYSICAL   CHIEF COMPLAINT:  "Yesterday I felt horrible and had a fever."  HISTORY OF PRESENT ILLNESS:  This 30 year old female with history of rheumatoid arthritis, new diagnosis of lupus, PID in the past apparently complains of subjective fever to 101.8.  The patient was also felt just generally horrible.  She complains of some headache and so received a lumbar puncture in the ED which was negative for meningitis.  The patient denies any cough, chest pain, palpitations, shortness of breath, nausea, vomiting, diarrhea, dysuria, hematuria.  She has generalized arthralgias and arthritis and stiffness.  The patient denies any rashes. The patient had CT brain which was negative for any acute process. Chest x-ray did not show any evidence of pneumonia.  Urinalysis was negative for UTI.  Urine pregnancy test is negative.  Rapid strep screen was negative.  The patient will be admitted for workup of fever of uncertain origin.  PAST MEDICAL HISTORY: 1. Rheumatoid arthritis. 2. Lupus. 3. PID  PAST SURGICAL HISTORY: 1. Nasal septoplasty and submucosal resection of the inferior     turbinates bilaterally by Dr. Brynda Peon, April 29, 2005. 2. Partial small-bowel obstruction status post laparoscopic lysis of     adhesions by Dr. Gaynelle Adu on January 31, 2010.  SOCIAL HISTORY:  The patient smokes 1 pack per day x15 years.  She does not drink.  She is married and has 2 children.  FAMILY HISTORY:  Grandmother had rheumatoid arthritis, diabetes and COPD.  Grandfather had lung cancer.  ALLERGIES:  No known drug allergies.  MEDICATIONS:  Bactrim DS one p.o.  b.i.d., methotrexate 2.5 mg p.o. daily on Monday, Wednesday, Friday.  REVIEW OF SYSTEMS:  Negative for all 10 organ systems except for pertinent positives stated above.  PHYSICAL EXAMINATION:  VITAL SIGNS:  Temperature 97.8, pulse 93, blood pressure 104/71, pulse ox 97% on room air. HEENT:  Anicteric. NECK:  No JVD. HEART:  Regular rate and rhythm.  S1, S2.  No murmurs, gallops or rubs. LUNGS:  Clear to auscultation bilaterally. ABDOMEN:  Soft, nontender, nondistended.  Positive bowel sounds. EXTREMITIES:  No cyanosis, clubbing or edema. SKIN:  No rashes. LYMPH NODES:  No adenopathy. NEURO EXAM:  Nonfocal.  Cranial nerves II through XII intact.  Reflexes 2+, symmetric, diffuse with downgoing toes bilaterally, motor strength 5/5 all 4 extremities, pinprick intact.  LABORATORY DATA:  Urine pregnancy test negative.  Urinalysis negative. INR 0.96, lactic acid 1.0.  Rapid strep negative.  Sodium 133, potassium 2.8, BUN 8, creatinine 0.84, AST 23, ALT 16, alk phos 69, total bilirubin 0.1.  WBC 1.4, hemoglobin 11.8, platelet count is 175.  CSF cell count wbc's 2, rbc's 0.  ASSESSMENT/PLAN: 1. Febrile neutropenia:  Blood cultures x2 sets are pending.  Check     ESR, CRP, C50, C3, C4, procalcitonin, and lactic acid.  The patient     was treated with vancomycin IV, cephradine 2 g IV q.8 for question     of febrile neutropenia.  Hold methotrexate.  Discontinue Bactrim. 2. Tobacco dependence.  The patient was counseled on smoking cessation     x10 minutes. 3. Dispo:  We will repeat a CBC, CMP in the a.m..  Hopefully with the     discontinuation of Bactrim, her white count will rise.     Differential diagnosis includes rheumatoid arthritis or lupus.     Please place the patient on neutropenic precautions.     Massie Maroon, MD     JYK/MEDQ  D:  11/12/2010  T:  11/12/2010  Job:  [id number]  cc:   Dr. Candie Echevaria  Dr. Ancil Linsey  Electronically Signed by Pearson Grippe MD on 12/16/2010 07:08:59  PM

## 2011-01-27 ENCOUNTER — Emergency Department (HOSPITAL_COMMUNITY)
Admission: EM | Admit: 2011-01-27 | Discharge: 2011-01-27 | Disposition: A | Discharge status: Home / Self Care | Payer: Medicaid Other | Attending: Emergency Medicine | Admitting: Emergency Medicine

## 2011-01-27 ENCOUNTER — Emergency Department (HOSPITAL_COMMUNITY): Payer: Medicaid Other

## 2011-01-27 DIAGNOSIS — M069 Rheumatoid arthritis, unspecified: Secondary | ICD-10-CM | POA: Insufficient documentation

## 2011-01-27 DIAGNOSIS — M329 Systemic lupus erythematosus, unspecified: Secondary | ICD-10-CM | POA: Insufficient documentation

## 2011-01-27 DIAGNOSIS — Z79899 Other long term (current) drug therapy: Secondary | ICD-10-CM | POA: Insufficient documentation

## 2011-01-27 LAB — COMPREHENSIVE METABOLIC PANEL
BUN: 11 mg/dL (ref 6–23)
CO2: 21 mEq/L (ref 19–32)
Calcium: 9.5 mg/dL (ref 8.4–10.5)
Chloride: 99 mEq/L (ref 96–112)
Creatinine, Ser: 0.62 mg/dL (ref 0.50–1.10)
GFR calc non Af Amer: >60 mL/min (ref >60)
Total Bilirubin: 0.4 mg/dL (ref 0.3–1.2)

## 2011-01-27 LAB — DIFFERENTIAL
Basophils Absolute: 0 10*3/uL (ref 0.0–0.1)
Basophils Relative: 0 % (ref 0–1)
Lymphocytes Relative: 22 % (ref 12–46)
Monocytes Absolute: 0.5 10*3/uL (ref 0.1–1.0)
Neutro Abs: 7.2 10*3/uL (ref 1.7–7.7)
Neutrophils Relative %: 71 % (ref 43–77)

## 2011-01-27 LAB — URINALYSIS, ROUTINE W REFLEX MICROSCOPIC
Bilirubin Urine: NEGATIVE
Ketones, ur: NEGATIVE mg/dL
Nitrite: NEGATIVE
Protein, ur: NEGATIVE mg/dL
Urobilinogen, UA: 0.2 mg/dL (ref 0.0–1.0)

## 2011-01-27 LAB — SEDIMENTATION RATE: Sed Rate: 28 mm/hr — ABNORMAL HIGH (ref 0–22)

## 2011-01-27 LAB — RAPID STREP SCREEN (MED CTR MEBANE ONLY): Streptococcus, Group A Screen (Direct): NEGATIVE

## 2011-01-27 LAB — POCT PREGNANCY, URINE: Preg Test, Ur: NEGATIVE

## 2011-01-27 LAB — CBC
HCT: 36.2 % (ref 36.0–46.0)
Hemoglobin: 11.9 g/dL — ABNORMAL LOW (ref 12.0–15.0)
MCHC: 32.9 g/dL (ref 30.0–36.0)
WBC: 10.2 10*3/uL (ref 4.0–10.5)

## 2011-03-24 ENCOUNTER — Emergency Department (HOSPITAL_COMMUNITY)
Admission: EM | Admit: 2011-03-24 | Discharge: 2011-03-24 | Disposition: A | Discharge status: Home / Self Care | Payer: Medicaid Other | Attending: Emergency Medicine | Admitting: Emergency Medicine

## 2011-03-24 DIAGNOSIS — IMO0001 Reserved for inherently not codable concepts without codable children: Secondary | ICD-10-CM | POA: Insufficient documentation

## 2011-03-24 DIAGNOSIS — Z79899 Other long term (current) drug therapy: Secondary | ICD-10-CM | POA: Insufficient documentation

## 2011-03-24 DIAGNOSIS — M069 Rheumatoid arthritis, unspecified: Secondary | ICD-10-CM | POA: Insufficient documentation

## 2011-03-24 DIAGNOSIS — M329 Systemic lupus erythematosus, unspecified: Secondary | ICD-10-CM | POA: Insufficient documentation

## 2011-03-24 DIAGNOSIS — L989 Disorder of the skin and subcutaneous tissue, unspecified: Secondary | ICD-10-CM | POA: Insufficient documentation

## 2011-03-24 DIAGNOSIS — L02619 Cutaneous abscess of unspecified foot: Secondary | ICD-10-CM | POA: Insufficient documentation

## 2011-03-24 LAB — URINALYSIS, ROUTINE W REFLEX MICROSCOPIC
Bilirubin Urine: NEGATIVE
Glucose, UA: NEGATIVE mg/dL
Hgb urine dipstick: NEGATIVE
Ketones, ur: NEGATIVE mg/dL
Leukocytes, UA: NEGATIVE
pH: 7 (ref 5.0–8.0)

## 2011-03-24 LAB — CBC
HCT: 38.1 % (ref 36.0–46.0)
MCV: 86.4 fL (ref 78.0–100.0)
RDW: 13.7 % (ref 11.5–15.5)
WBC: 10.2 10*3/uL (ref 4.0–10.5)

## 2011-03-24 LAB — BASIC METABOLIC PANEL
BUN: 8 mg/dL (ref 6–23)
CO2: 25 mEq/L (ref 19–32)
Chloride: 102 mEq/L (ref 96–112)
Creatinine, Ser: 0.66 mg/dL (ref 0.50–1.10)
GFR calc Af Amer: >60 mL/min (ref >60)

## 2011-03-24 LAB — DIFFERENTIAL
Eosinophils Relative: 2 % (ref 0–5)
Lymphocytes Relative: 22 % (ref 12–46)
Lymphs Abs: 2.2 10*3/uL (ref 0.7–4.0)

## 2011-07-10 ENCOUNTER — Encounter (HOSPITAL_COMMUNITY): Payer: Self-pay | Admitting: Pharmacist

## 2011-07-18 ENCOUNTER — Other Ambulatory Visit (HOSPITAL_COMMUNITY): Payer: Medicaid Other

## 2011-08-06 ENCOUNTER — Encounter (HOSPITAL_COMMUNITY): Payer: Self-pay

## 2011-08-06 ENCOUNTER — Encounter (HOSPITAL_COMMUNITY)
Admission: RE | Admit: 2011-08-06 | Discharge: 2011-08-06 | Disposition: A | Discharge status: Home / Self Care | Payer: Medicaid Other | Source: Ambulatory Visit | Attending: Obstetrics and Gynecology | Admitting: Obstetrics and Gynecology

## 2011-08-06 HISTORY — DX: Anxiety disorder, unspecified: F41.9

## 2011-08-06 HISTORY — DX: Major depressive disorder, single episode, unspecified: F32.9

## 2011-08-06 HISTORY — DX: Depression, unspecified: F32.A

## 2011-08-06 HISTORY — DX: Anemia, unspecified: D64.9

## 2011-08-06 HISTORY — DX: Systemic lupus erythematosus, unspecified: M32.9

## 2011-08-06 HISTORY — DX: Unspecified osteoarthritis, unspecified site: M19.90

## 2011-08-06 HISTORY — DX: Reserved for concepts with insufficient information to code with codable children: IMO0002

## 2011-08-06 LAB — COMPREHENSIVE METABOLIC PANEL
BUN: 8 mg/dL (ref 6–23)
CO2: 22 mEq/L (ref 19–32)
Calcium: 9 mg/dL (ref 8.4–10.5)
Creatinine, Ser: 0.62 mg/dL (ref 0.50–1.10)
GFR calc Af Amer: >90 mL/min (ref >90)
GFR calc non Af Amer: >90 mL/min (ref >90)
Glucose, Bld: 95 mg/dL (ref 70–99)

## 2011-08-06 LAB — CBC
HCT: 35.6 % — ABNORMAL LOW (ref 36.0–46.0)
Hemoglobin: 11.7 g/dL — ABNORMAL LOW (ref 12.0–15.0)
MCH: 28.1 pg (ref 26.0–34.0)
MCV: 85.6 fL (ref 78.0–100.0)
RBC: 4.16 MIL/uL (ref 3.87–5.11)

## 2011-08-06 NOTE — Pre-Procedure Instructions (Signed)
Ok to see patient DOS. 

## 2011-08-06 NOTE — Patient Instructions (Addendum)
   Your procedure is scheduled on: Tuesday, Feb. 5th  Enter through the [employer] of [facility] at: 830am Pick up the phone at the desk and dial [phone] and inform us of your arrival.  Please call this number if you have any problems the morning of surgery: [phone]  Remember: Do not eat food after midnight: Monday Do not drink clear liquids after: Monday Take these medicines the morning of surgery with a SIP OF WATER: Zoloft, Oxycodone, Prednisone  Do not wear jewelry, make-up, or FINGER nail polish Do not wear lotions, powders, perfumes or deodorant. Do not shave 48 hours prior to surgery. Do not bring valuables to the hospital.  Leave suitcase in the car. After Surgery it may be brought to your room. For patients being admitted to the hospital, checkout time is 11:00am the day of discharge.  Home with Mother Deatra James LaFata.  Patients discharged on the day of surgery will not be allowed to drive home.     Remember to use your hibiclens as instructed.Please shower with 1/2 bottle the evening before your surgery and the other 1/2 bottle the morning of surgery.

## 2011-08-12 MED ORDER — CEFAZOLIN SODIUM-DEXTROSE 2-3 GM-% IV SOLR
2.0000 g | INTRAVENOUS | Status: AC
  Administered 2011-08-13: 2 g via INTRAVENOUS
  Filled 2011-08-12: qty 50

## 2011-08-13 ENCOUNTER — Other Ambulatory Visit: Payer: Self-pay | Admitting: Obstetrics and Gynecology

## 2011-08-13 ENCOUNTER — Encounter (HOSPITAL_COMMUNITY): Payer: Self-pay | Admitting: *Deleted

## 2011-08-13 ENCOUNTER — Encounter (HOSPITAL_COMMUNITY)
Admission: RE | Disposition: A | Discharge status: Home / Self Care | Payer: Self-pay | Source: Ambulatory Visit | Attending: Obstetrics and Gynecology

## 2011-08-13 ENCOUNTER — Encounter (HOSPITAL_COMMUNITY): Payer: Self-pay | Admitting: Anesthesiology

## 2011-08-13 ENCOUNTER — Inpatient Hospital Stay (HOSPITAL_COMMUNITY): Payer: Medicaid Other | Admitting: Anesthesiology

## 2011-08-13 ENCOUNTER — Inpatient Hospital Stay (HOSPITAL_COMMUNITY)
Admission: RE | Admit: 2011-08-13 | Discharge: 2011-08-14 | DRG: 743 | Disposition: A | Discharge status: Home / Self Care | Payer: Medicaid Other | Source: Ambulatory Visit | Attending: Obstetrics and Gynecology | Admitting: Obstetrics and Gynecology

## 2011-08-13 DIAGNOSIS — Z01812 Encounter for preprocedural laboratory examination: Secondary | ICD-10-CM

## 2011-08-13 DIAGNOSIS — Z01818 Encounter for other preprocedural examination: Secondary | ICD-10-CM

## 2011-08-13 DIAGNOSIS — N920 Excessive and frequent menstruation with regular cycle: Principal | ICD-10-CM | POA: Diagnosis present

## 2011-08-13 HISTORY — PX: CYSTOSCOPY: SHX5120

## 2011-08-13 HISTORY — PX: VAGINAL HYSTERECTOMY: SHX2639

## 2011-08-13 LAB — HCG, QUANTITATIVE, PREGNANCY: hCG, Beta Chain, Quant, S: 16987 m[IU]/mL — ABNORMAL HIGH (ref <5)

## 2011-08-13 LAB — PREGNANCY, URINE: Preg Test, Ur: POSITIVE — AB

## 2011-08-13 SURGERY — HYSTERECTOMY, VAGINAL
Anesthesia: General | Site: Vagina | Wound class: Clean Contaminated

## 2011-08-13 MED ORDER — FENTANYL CITRATE 0.05 MG/ML IJ SOLN
INTRAMUSCULAR | Status: DC | PRN
  Administered 2011-08-13: 50 ug via INTRAVENOUS
  Administered 2011-08-13 (×3): 100 ug via INTRAVENOUS
  Administered 2011-08-13: 50 ug via INTRAVENOUS
  Administered 2011-08-13: 100 ug via INTRAVENOUS

## 2011-08-13 MED ORDER — ACETAMINOPHEN 10 MG/ML IV SOLN
INTRAVENOUS | Status: DC | PRN
  Administered 2011-08-13: 1000 mg via INTRAVENOUS

## 2011-08-13 MED ORDER — HYDROMORPHONE HCL PF 1 MG/ML IJ SOLN
INTRAMUSCULAR | Status: AC
  Filled 2011-08-13: qty 1

## 2011-08-13 MED ORDER — ACETAMINOPHEN 325 MG PO TABS: 325.0000 mg | ORAL_TABLET | ORAL | Status: DC | PRN

## 2011-08-13 MED ORDER — SIMETHICONE 80 MG PO CHEW: 80.0000 mg | CHEWABLE_TABLET | Freq: Four times a day (QID) | ORAL | Status: DC | PRN

## 2011-08-13 MED ORDER — DEXTROSE-NACL 5-0.45 % IV SOLN
INTRAVENOUS | Status: DC
  Administered 2011-08-13 – 2011-08-14 (×2): via INTRAVENOUS

## 2011-08-13 MED ORDER — NALOXONE HCL 0.4 MG/ML IJ SOLN: 0.4000 mg | INTRAMUSCULAR | Status: DC | PRN

## 2011-08-13 MED ORDER — ONDANSETRON HCL 4 MG/2ML IJ SOLN
INTRAMUSCULAR | Status: DC | PRN
  Administered 2011-08-13: 4 mg via INTRAVENOUS

## 2011-08-13 MED ORDER — DIPHENHYDRAMINE HCL 12.5 MG/5ML PO ELIX: 12.5000 mg | ORAL_SOLUTION | Freq: Four times a day (QID) | ORAL | Status: DC | PRN

## 2011-08-13 MED ORDER — MENTHOL 3 MG MT LOZG: 1.0000 | LOZENGE | OROMUCOSAL | Status: DC | PRN

## 2011-08-13 MED ORDER — SODIUM CHLORIDE 0.9 % IJ SOLN: 9.0000 mL | INTRAMUSCULAR | Status: DC | PRN

## 2011-08-13 MED ORDER — KETAMINE HCL 50 MG/ML IJ SOLN
INTRAMUSCULAR | Status: DC | PRN
  Administered 2011-08-13: 50 mg via INTRAVENOUS

## 2011-08-13 MED ORDER — HYDROMORPHONE 0.3 MG/ML IV SOLN
INTRAVENOUS | Status: AC
  Filled 2011-08-13: qty 25

## 2011-08-13 MED ORDER — PREDNISONE 10 MG PO TABS
10.0000 mg | ORAL_TABLET | Freq: Every day | ORAL | Status: DC
  Administered 2011-08-14: 10 mg via ORAL
  Filled 2011-08-13 (×2): qty 1

## 2011-08-13 MED ORDER — MIDAZOLAM HCL 2 MG/2ML IJ SOLN
INTRAMUSCULAR | Status: AC
  Filled 2011-08-13: qty 2

## 2011-08-13 MED ORDER — DIPHENHYDRAMINE HCL 50 MG/ML IJ SOLN: 12.5000 mg | Freq: Four times a day (QID) | INTRAMUSCULAR | Status: DC | PRN

## 2011-08-13 MED ORDER — HYDROMORPHONE 0.3 MG/ML IV SOLN
INTRAVENOUS | Status: DC
  Administered 2011-08-13: 14:00:00 via INTRAVENOUS

## 2011-08-13 MED ORDER — KETOROLAC TROMETHAMINE 30 MG/ML IJ SOLN
30.0000 mg | Freq: Once | INTRAMUSCULAR | Status: DC
  Filled 2011-08-13 (×2): qty 1

## 2011-08-13 MED ORDER — HYDROMORPHONE HCL PF 1 MG/ML IJ SOLN
1.0000 mg | INTRAMUSCULAR | Status: DC | PRN
  Administered 2011-08-13 (×2): 1 mg via INTRAVENOUS

## 2011-08-13 MED ORDER — FENTANYL CITRATE 0.05 MG/ML IJ SOLN
INTRAMUSCULAR | Status: AC
  Filled 2011-08-13: qty 5

## 2011-08-13 MED ORDER — VASOPRESSIN 20 UNIT/ML IJ SOLN
INTRAVENOUS | Status: DC | PRN
  Administered 2011-08-13: 11:00:00 via INTRAMUSCULAR

## 2011-08-13 MED ORDER — HYDROMORPHONE HCL PF 1 MG/ML IJ SOLN
INTRAMUSCULAR | Status: AC
  Administered 2011-08-13: 1 mg via INTRAVENOUS
  Filled 2011-08-13: qty 1

## 2011-08-13 MED ORDER — MAGNESIUM SULFATE 50 % IJ SOLN: 2.0000 g | Freq: Once | INTRAMUSCULAR | Status: DC

## 2011-08-13 MED ORDER — METHYLPREDNISOLONE SODIUM SUCC 125 MG IJ SOLR
INTRAMUSCULAR | Status: AC
  Filled 2011-08-13: qty 2

## 2011-08-13 MED ORDER — HYDROMORPHONE 0.3 MG/ML IV SOLN
INTRAVENOUS | Status: DC
Start: 2011-08-13 — End: 2011-08-14
  Administered 2011-08-13: 20:00:00 via INTRAVENOUS
  Administered 2011-08-14 (×2): 5.49 mg via INTRAVENOUS
  Administered 2011-08-14: 03:00:00 via INTRAVENOUS
  Administered 2011-08-14: 1.99 mg via INTRAVENOUS

## 2011-08-13 MED ORDER — MIDAZOLAM HCL 5 MG/5ML IJ SOLN
INTRAMUSCULAR | Status: DC | PRN
  Administered 2011-08-13: 2 mg via INTRAVENOUS

## 2011-08-13 MED ORDER — SERTRALINE HCL 25 MG PO TABS
25.0000 mg | ORAL_TABLET | Freq: Every day | ORAL | Status: DC
  Administered 2011-08-14: 25 mg via ORAL
  Filled 2011-08-13 (×2): qty 1

## 2011-08-13 MED ORDER — ONDANSETRON HCL 4 MG PO TABS: 4.0000 mg | ORAL_TABLET | Freq: Four times a day (QID) | ORAL | Status: DC | PRN

## 2011-08-13 MED ORDER — INDIGOTINDISULFONATE SODIUM 8 MG/ML IJ SOLN
INTRAMUSCULAR | Status: DC | PRN
  Administered 2011-08-13: 40 mg via INTRAVENOUS

## 2011-08-13 MED ORDER — INDIGOTINDISULFONATE SODIUM 8 MG/ML IJ SOLN
INTRAMUSCULAR | Status: AC
  Filled 2011-08-13: qty 5

## 2011-08-13 MED ORDER — MAGNESIUM SULFATE 50 % IJ SOLN
INTRAMUSCULAR | Status: DC | PRN
  Administered 2011-08-13: 2 g via INTRAVENOUS

## 2011-08-13 MED ORDER — ONDANSETRON HCL 4 MG/2ML IJ SOLN: 4.0000 mg | Freq: Four times a day (QID) | INTRAMUSCULAR | Status: DC | PRN

## 2011-08-13 MED ORDER — FENTANYL CITRATE 0.05 MG/ML IJ SOLN: 25.0000 ug | INTRAMUSCULAR | Status: DC | PRN

## 2011-08-13 MED ORDER — KETAMINE HCL 100 MG/ML IJ SOLN
INTRAMUSCULAR | Status: AC
  Filled 2011-08-13: qty 1

## 2011-08-13 MED ORDER — ALPRAZOLAM 0.5 MG PO TABS
1.0000 mg | ORAL_TABLET | Freq: Three times a day (TID) | ORAL | Status: DC | PRN
  Administered 2011-08-13: 1 mg via ORAL
  Filled 2011-08-13: qty 2

## 2011-08-13 MED ORDER — PROMETHAZINE HCL 25 MG/ML IJ SOLN: 6.2500 mg | INTRAMUSCULAR | Status: DC | PRN

## 2011-08-13 MED ORDER — KETOROLAC TROMETHAMINE 30 MG/ML IJ SOLN
30.0000 mg | Freq: Four times a day (QID) | INTRAMUSCULAR | Status: DC
  Administered 2011-08-14 (×2): 30 mg via INTRAVENOUS
  Filled 2011-08-13: qty 1

## 2011-08-13 MED ORDER — ZOLPIDEM TARTRATE 5 MG PO TABS: 5.0000 mg | ORAL_TABLET | Freq: Every evening | ORAL | Status: DC | PRN

## 2011-08-13 MED ORDER — STERILE WATER FOR IRRIGATION IR SOLN
Status: DC | PRN
  Administered 2011-08-13: 1000 mL

## 2011-08-13 MED ORDER — EPINEPHRINE HCL 0.1 MG/ML IJ SOLN
INTRAMUSCULAR | Status: AC
  Filled 2011-08-13: qty 10

## 2011-08-13 MED ORDER — HYDROMORPHONE HCL PF 1 MG/ML IJ SOLN
1.0000 mg | Freq: Once | INTRAMUSCULAR | Status: AC
  Administered 2011-08-13: 1 mg via INTRAVENOUS

## 2011-08-13 MED ORDER — OXYCODONE-ACETAMINOPHEN 5-325 MG PO TABS: 1.0000 | ORAL_TABLET | ORAL | Status: DC | PRN

## 2011-08-13 MED ORDER — PROPOFOL 10 MG/ML IV EMUL
INTRAVENOUS | Status: DC | PRN
  Administered 2011-08-13: 200 mg via INTRAVENOUS

## 2011-08-13 MED ORDER — LIDOCAINE HCL (CARDIAC) 20 MG/ML IV SOLN
INTRAVENOUS | Status: DC | PRN
  Administered 2011-08-13: 40 mg via INTRAVENOUS

## 2011-08-13 MED ORDER — ONDANSETRON HCL 4 MG/2ML IJ SOLN
INTRAMUSCULAR | Status: AC
  Filled 2011-08-13: qty 2

## 2011-08-13 MED ORDER — DOCUSATE SODIUM 100 MG PO CAPS
100.0000 mg | ORAL_CAPSULE | Freq: Two times a day (BID) | ORAL | Status: DC
  Administered 2011-08-13 – 2011-08-14 (×3): 100 mg via ORAL
  Filled 2011-08-13 (×3): qty 1

## 2011-08-13 MED ORDER — KETOROLAC TROMETHAMINE 30 MG/ML IJ SOLN
INTRAMUSCULAR | Status: AC
  Filled 2011-08-13: qty 1

## 2011-08-13 MED ORDER — MAGNESIUM SULFATE 50 % IJ SOLN
2.0000 g | Freq: Once | INTRAVENOUS | Status: DC
  Filled 2011-08-13: qty 4

## 2011-08-13 MED ORDER — KETOROLAC TROMETHAMINE 30 MG/ML IJ SOLN: 30.0000 mg | Freq: Four times a day (QID) | INTRAMUSCULAR | Status: DC

## 2011-08-13 MED ORDER — VASOPRESSIN 20 UNIT/ML IJ SOLN
INTRAMUSCULAR | Status: AC
  Filled 2011-08-13: qty 1

## 2011-08-13 MED ORDER — ALUM & MAG HYDROXIDE-SIMETH 200-200-20 MG/5ML PO SUSP: 30.0000 mL | ORAL | Status: DC | PRN

## 2011-08-13 MED ORDER — KETOROLAC TROMETHAMINE 30 MG/ML IJ SOLN
15.0000 mg | Freq: Once | INTRAMUSCULAR | Status: AC | PRN
  Administered 2011-08-13: 30 mg via INTRAVENOUS

## 2011-08-13 MED ORDER — EPINEPHRINE HCL 1 MG/ML IJ SOLN
INTRAMUSCULAR | Status: DC | PRN
  Administered 2011-08-13: .04 mg via INTRAVENOUS

## 2011-08-13 MED ORDER — METHYLPREDNISOLONE SODIUM SUCC 125 MG IJ SOLR
INTRAMUSCULAR | Status: DC | PRN
  Administered 2011-08-13: 125 mg via INTRAVENOUS

## 2011-08-13 MED ORDER — LACTATED RINGERS IV SOLN
INTRAVENOUS | Status: DC
  Administered 2011-08-13: 1000 mL via INTRAVENOUS
  Administered 2011-08-13: 09:00:00 via INTRAVENOUS

## 2011-08-13 MED ORDER — METHYLPREDNISOLONE SODIUM SUCC 125 MG IJ SOLR
125.0000 mg | Freq: Two times a day (BID) | INTRAMUSCULAR | Status: AC
  Administered 2011-08-13 – 2011-08-14 (×2): 125 mg via INTRAVENOUS
  Filled 2011-08-13 (×2): qty 2

## 2011-08-13 MED ORDER — ACETAMINOPHEN 10 MG/ML IV SOLN
1000.0000 mg | Freq: Four times a day (QID) | INTRAVENOUS | Status: DC | PRN
  Filled 2011-08-13 (×2): qty 100

## 2011-08-13 SURGICAL SUPPLY — 24 items
CANISTER SUCTION 2500CC (MISCELLANEOUS) ×2 IMPLANT
CLOTH BEACON ORANGE TIMEOUT ST (SAFETY) ×2 IMPLANT
CONT PATH 16OZ SNAP LID 3702 (MISCELLANEOUS) IMPLANT
DECANTER SPIKE VIAL GLASS SM (MISCELLANEOUS) IMPLANT
GLOVE BIO SURGEON STRL SZ8 (GLOVE) ×2 IMPLANT
GLOVE BIOGEL PI IND STRL 6.5 (GLOVE) ×1 IMPLANT
GLOVE BIOGEL PI INDICATOR 6.5 (GLOVE) ×1
GLOVE ORTHO TXT STRL SZ7.5 (GLOVE) ×2 IMPLANT
GOWN STRL REIN XL XLG (GOWN DISPOSABLE) ×2 IMPLANT
NDL SPNL 22GX3.5 QUINCKE BK (NEEDLE) IMPLANT
NEEDLE SPNL 22GX3.5 QUINCKE BK (NEEDLE) IMPLANT
NS IRRIG 1000ML POUR BTL (IV SOLUTION) ×2 IMPLANT
PACK VAGINAL WOMENS (CUSTOM PROCEDURE TRAY) ×2 IMPLANT
SET CYSTO W/LG BORE CLAMP LF (SET/KITS/TRAYS/PACK) ×2 IMPLANT
SUT CHROMIC 1 TIES 18 (SUTURE) ×2 IMPLANT
SUT CHROMIC 1MO 4 18 CR8 (SUTURE) ×6 IMPLANT
SUT SILK 2 0 SH (SUTURE) ×2 IMPLANT
SUT VIC AB 2-0 CT1 27 (SUTURE) ×2
SUT VIC AB 2-0 CT1 TAPERPNT 27 (SUTURE) ×1 IMPLANT
SUT VIC AB 3-0 SH 27 (SUTURE)
SUT VIC AB 3-0 SH 27X BRD (SUTURE) IMPLANT
TOWEL OR 17X24 6PK STRL BLUE (TOWEL DISPOSABLE) ×4 IMPLANT
TRAY FOLEY CATH 14FR (SET/KITS/TRAYS/PACK) ×2 IMPLANT
WATER STERILE IRR 1000ML POUR (IV SOLUTION) ×2 IMPLANT

## 2011-08-13 NOTE — Anesthesia Postprocedure Evaluation (Addendum)
Anesthesia Post Note  Patient: [name]  Procedure(s) Performed:  HYSTERECTOMY VAGINAL; CYSTOSCOPY  Anesthesia type: GA  Patient location: PACU  Post pain: Pain level controlled but difficulty to control.  Pt educated as to this likelihood of this situation post operatively despite multimodal analgesia.  Post assessment: Post-op Vital signs reviewed  Last Vitals:  Filed Vitals:   08/13/11 1230  BP: 107/74  Pulse: 71  Temp:   Resp: 21    Post vital signs: Reviewed  Level of consciousness: sedated  Complications: No apparent anesthesia complications

## 2011-08-13 NOTE — Progress Notes (Signed)
Pt c/o continuous pain throughout day unrelieved by any anesthesia additional meds in PACU, Full Dose PCA Dilaudid during afternoon, and increase in PCA Dilaudid dosage in early evening.  Non-pharmacological measures used during afternoon:  Heating pad to abdomen and lavender aromatherapy.  Phoned Dr. Arby Barrette with above information.  He stated that pt had already received a variety of medications and that she declined an epidural for pain relief earlier in day.  He stated that unless pt decided on an epidural that the anesthesia department had nothing else to offer her.  Explained Dr. Flonnie Overman response to patient.   She stated "I am NOT getting an epidural.  I guess I'll just suffer."  Asked patient to notify oncoming RN if she changed her mind.

## 2011-08-13 NOTE — Transfer of Care (Signed)
Immediate Anesthesia Transfer of Care Note  Patient: [name]  Procedure(s) Performed:  HYSTERECTOMY VAGINAL; CYSTOSCOPY  Patient Location: PACU  Anesthesia Type: General  Level of Consciousness: awake, alert  and oriented  Airway & Oxygen Therapy: Patient Spontanous Breathing and Patient connected to nasal cannula oxygen  Post-op Assessment: Report given to PACU RN, Post -op Vital signs reviewed and stable and Patient moving all extremities X 4  Post vital signs: Reviewed and stable  Complications: No apparent anesthesia complications

## 2011-08-13 NOTE — H&P (Signed)
[name] is an 31 y.o. female, G7, P 2042, with menorrhagia admitted for definitive surgical therapy. She was actually doing well with a Mirena until last summer, had it removed when diagnosed with PID.  Since then menses are much heavier and she wants hysterectomy.  Pregnancy test today is positive, quant about 16,000, she understands and wants to proceed.  Surgery was scheduled for about a month ago but cancelled due to a respiratory issue requiring steroids.    Pertinent Gynecological History: Last pap: normal Date: 12-12  OB History: G7, P2042   Menstrual History: LMP 12-22, c/w HCG level No LMP recorded.    Past Medical History  Diagnosis Date  . Anemia   . Anxiety   . Depression   . Arthritis     hands, feet, elbows, knees  . Lupus   h/o cervical dysplasia  Past Surgical History  Procedure Date  . Septoplasty 04/2005  . Diagnostic laparoscopy 01/2010    w/LOA  . Wisdom tooth extraction   . Svd     X 2    History reviewed. No pertinent family history.  Social History:  reports that she has been smoking Cigarettes.  She has a 7.5 pack-year smoking history. She has never used smokeless tobacco. She reports that she does not drink alcohol or use illicit drugs.  Allergies:  Allergies  Allergen Reactions  . Iron Anaphylaxis    Pt states that she had anaphylaxis to IV iron    Prescriptions prior to admission  Medication Sig Dispense Refill  . oxyCODONE (OXYCONTIN) 10 MG 12 hr tablet Take 10 mg by mouth every 12 (twelve) hours as needed. For pain.      . predniSONE (DELTASONE) 10 MG tablet Take 10 mg by mouth daily.        . sertraline (ZOLOFT) 25 MG tablet Take 25 mg by mouth daily.        Review of Systems  Respiratory: Negative.   Cardiovascular: Negative.   Gastrointestinal: Negative.   Genitourinary: Negative.     Blood pressure 105/61, temperature 97.3 F (36.3 C), temperature source Oral, resp. rate 18, SpO2 100.00%. Physical Exam  Constitutional:  She appears well-developed and well-nourished.  Neck: Neck supple. No thyromegaly present.  Cardiovascular: Normal rate, regular rhythm and normal heart sounds.   No murmur heard. Respiratory: Effort normal and breath sounds normal. No respiratory distress. She has no wheezes.  GI: Soft. She exhibits no distension and no mass. There is no tenderness.  Genitourinary: Vagina normal.       Uterus midplanar, normal size No adnexal mass or tenderness    Results for orders placed during the hospital encounter of 08/13/11 (from the past 24 hour(s))  PREGNANCY, URINE     Status: Abnormal   Collection Time   08/13/11  8:32 AM      Component Value Range   Preg Test, Ur POSITIVE (*) NEGATIVE   HCG, QUANTITATIVE, PREGNANCY     Status: Abnormal   Collection Time   08/13/11  9:25 AM      Component Value Range   hCG, Beta Chain, Quant, S [address]109 (*) <5 (mIU/mL)    No results found.  Assessment/Plan: Menorrhagia, also with positive pregnancy test today.  All medical and surgical options have been discussed, she wants definitive surgical therapy.  Discussed that she will be terminating a potentially viable pregnancy, she understands.  Discussed procedure, risks, alternatives, chances of success.  Will proceed with TVH.    Ninoshka Wainwright D 08/13/2011,  10:33 AM

## 2011-08-13 NOTE — Progress Notes (Signed)
Post-op check, TVH/cysto In mod-severe pain, no n/v Afeb, VSS Abd- soft Will increase PCA dose, has tolerance from chronic oxycodone use

## 2011-08-13 NOTE — Op Note (Signed)
Preoperative diagnosis: Menorrhagia, early pregnant Postop diagnosis: Same Procedure: Total vaginal hysterectomy and cystoscopy Surgeon: Lavina Hamman M.D. Assistant: Sherron Monday M.D. Anesthesia: Gen. With an LMA Findings: Patient had a slightly enlarged uterus with normal tubes and ovaries. Via cystoscopy bladder was normal and both ureters were patent Estimated blood loss: 100 cc Specimens: Uterus into routine pathology Complications: None  Procedure in detail: The patient was taken to the operating room and placed in the dorsosupine position. General anesthesia was induced and she was placed in mobile stirrups. Legs were elevated in the stirrups. Perineum and vagina were then prepped and draped in the usual sterile fashion, bladder drained with red Robinson catheter. A Graves speculum was inserted in the vagina and the cervix was grasped with Christella Hartigan tenaculums. Dilute Pitressin was then instilled at the cervicovaginal junction which was then incised circumferentially with electrocautery. Sharp dissection was then used to further free the vagina from the cervix. Anterior peritoneum was identified and entered sharply. A Deaver retractor was used to retract the bladder anteriorly. Posterior cul-de-sac was identified and entered sharply. A Bonnano speculum was placed into the posterior cul-de-sac. Uterosacral ligaments were clamped transected and ligated with #1 chromic and tagged for later use. Uterine arteries, cardinal ligaments and broad ligaments were also clamped, transected and ligated with #1 chromic. Utero-ovarian pedicles were then clamped transected and doubly ligated with #1 chromic and tagged for inspection as the uterus was removed. Bleeding from each side was controlled with a figure-of-eight suture of #1 chromic. Tubes and ovaries were inspected and found to be normal. The uterosacral ligaments were then plicated in the midline with 2-0 silk. The Bonnano speculum was removed and a shorter  vaginal speculum was placed. The previously tagged uterosacral pedicles were also tied in the midline. No significant bleeding was identified. The vagina was then closed in a vertical fashion with running locking 2-0 Vicryl with adequate closure and adequate hemostasis.  At the beginning of the hysterectomy, with downward traction on the cervix, the patient had about 20 seconds of asystole managed by anesthesia.  She was stable throughout the rest of the case.    Attention was turned to cystoscopy. The patient had been given indigo carmine IV. A 70 cystoscope was inserted and 100 cc of fluid was instilled. The bladder appeared normal. Both ureteral orifices were easily identified and blue tinged urine was seen to flow freely from each orifice. The cystoscope was removed and a Foley catheter was placed. The patient was taken down from stirrups. She was awakened in the operating room and taken to the recovery room in stable condition after tolerating the procedure well. Counts were correct, she had PAS hose on throughout the procedure, she received Ancef preop.

## 2011-08-13 NOTE — Anesthesia Preprocedure Evaluation (Addendum)
Anesthesia Evaluation  Patient identified by MRN, date of birth, ID band Patient awake    Reviewed: Allergy & Precautions, H&P , Patient's Chart, lab work & pertinent test results, reviewed documented beta blocker date and time   History of Anesthesia Complications Negative for: history of anesthetic complications  Airway Mallampati: II TM Distance: >3 FB Neck ROM: full    Dental No notable dental hx.    Pulmonary neg pulmonary ROS,  clear to auscultation  Pulmonary exam normal       Cardiovascular Exercise Tolerance: Good neg cardio ROS regular Normal    Neuro/Psych PSYCHIATRIC DISORDERS Negative Neurological ROS  Negative Psych ROS   GI/Hepatic negative GI ROS, Neg liver ROS,   Endo/Other  Negative Endocrine ROS  Renal/GU negative Renal ROS     Musculoskeletal   Abdominal   Peds  Hematology negative hematology ROS (+)   Anesthesia Other Findings lupus  Reproductive/Obstetrics negative OB ROS                           Anesthesia Physical Anesthesia Plan  ASA: III  Anesthesia Plan: General LMA   Post-op Pain Management:    Induction:   Airway Management Planned:   Additional Equipment:   Intra-op Plan:   Post-operative Plan:   Informed Consent: I have reviewed the patients History and Physical, chart, labs and discussed the procedure including the risks, benefits and alternatives for the proposed anesthesia with the patient or authorized representative who has indicated his/her understanding and acceptance.   Dental Advisory Given  Plan Discussed with: CRNA, Surgeon and Anesthesiologist  Anesthesia Plan Comments:        Anesthesia Quick Evaluation

## 2011-08-14 ENCOUNTER — Encounter (HOSPITAL_COMMUNITY): Payer: Self-pay | Admitting: Obstetrics and Gynecology

## 2011-08-14 DIAGNOSIS — N920 Excessive and frequent menstruation with regular cycle: Secondary | ICD-10-CM | POA: Diagnosis present

## 2011-08-14 LAB — CBC
Platelets: 154 10*3/uL (ref 150–400)
RDW: 13.7 % (ref 11.5–15.5)
WBC: 6.6 10*3/uL (ref 4.0–10.5)

## 2011-08-14 MED ORDER — OXYCODONE-ACETAMINOPHEN 5-325 MG PO TABS: 1.0000 | ORAL_TABLET | ORAL | Status: AC | PRN

## 2011-08-14 MED ORDER — HYDROMORPHONE 0.3 MG/ML IV SOLN
INTRAVENOUS | Status: AC
  Filled 2011-08-14: qty 25

## 2011-08-14 MED ORDER — OXYCODONE HCL 10 MG PO TB12
10.0000 mg | ORAL_TABLET | Freq: Two times a day (BID) | ORAL | Status: DC
  Administered 2011-08-14: 10 mg via ORAL
  Filled 2011-08-14: qty 1

## 2011-08-14 MED ORDER — OXYCODONE-ACETAMINOPHEN 5-325 MG PO TABS
1.0000 | ORAL_TABLET | ORAL | Status: DC | PRN
  Administered 2011-08-14: 2 via ORAL
  Filled 2011-08-14: qty 2

## 2011-08-14 NOTE — Progress Notes (Signed)
UR chart review completed.  

## 2011-08-14 NOTE — Discharge Summary (Signed)
Physician Discharge Summary  Patient ID: NAVAH GRONDIN MRN: [id number] DOB/AGE: 31/20/1982 30 y.o.  Admit date: 08/13/2011 Discharge date: 08/14/2011  Admission Diagnoses:  Menorrhagia, positive pregnancy test Discharge Diagnoses:  Menorrhagia, positive pregnancy test Active Problems:  Menorrhagia   Discharged Condition: good  Hospital Course: Underwent TVH and cystoscopy without complications.  Difficult to control pain post-op, but better when restarted her home dose of Oxycontin.    Consults: None  Treatments: surgery: TVH/cysto  Discharge Exam: Blood pressure 111/61, pulse 71, temperature 98.2 F (36.8 C), temperature source Oral, resp. rate 18, height 5\' 9"  (1.753 m), weight 81.647 kg (180 lb), SpO2 98.00%.   Disposition: Home or Self Care  Discharge Orders    Future Orders Please Complete By Expires   Diet - low sodium heart healthy      Increase activity slowly        Medication List  As of 08/14/2011 12:06 PM   TAKE these medications         oxyCODONE 10 MG 12 hr tablet   Commonly known as: OXYCONTIN   Take 10 mg by mouth every 12 (twelve) hours as needed. For pain.      oxyCODONE-acetaminophen 5-325 MG per tablet   Commonly known as: PERCOCET   Take 1-2 tablets by mouth every 4 (four) hours as needed (moderate to severe pain (when tolerating fluids)).      predniSONE 10 MG tablet   Commonly known as: DELTASONE   Take 10 mg by mouth daily.      sertraline 25 MG tablet   Commonly known as: ZOLOFT   Take 25 mg by mouth daily.           Follow-up Information    Follow up with Marisue Canion D, MD. Schedule an appointment as soon as possible for a visit in 6 weeks.   Contact information:   [address] [place] [address] [phone]          Signed: Zenaida Niece 08/14/2011, 12:06 PM

## 2011-08-14 NOTE — Progress Notes (Signed)
POD #1 TVH Still with pain, some nausea, no emesis Afeb, VSS Abd- soft Hgb 11.7 to 9.9 Will d/c PCA, restart Oxycontin she was on prior to admission and see how pain is at lunch, try for d/c home then

## 2011-12-31 IMAGING — CT CT ENTEROGRAPHY (ABD-PELV W/ CM)
2 of 5 series · 17 of 46 positions shown, 19 images · IV contrast (agent unspecified)
Comparison: CT scan 01/27/2010.

CLINICAL DATA: Small bowel obstruction.

CT ABDOMEN AND PELVIS WITH CONTRAST (CT ENTEROGRAPHY)
TECHNIQUE: Multidetector CT of the abdomen and pelvis during bolus
administration of intravenous contrast. Negative oral contrast
VoLumen was given.
Contrast: 125 ml Bmnipaque-PBB intravenously and 6841 ml VoLumen
orally.

[Series 3: enterography thins pacs · axial · 0.74mm/px · z∈[-461,-29]mm · 14 of 162 slices shown, 16 images]
[im 9/162  soft-tissue]
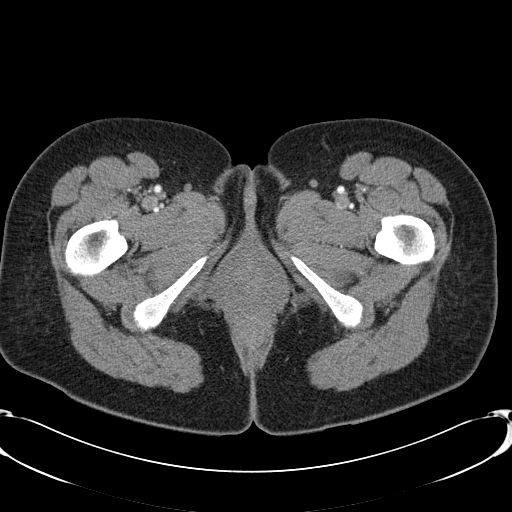
[im 9/162  bone]
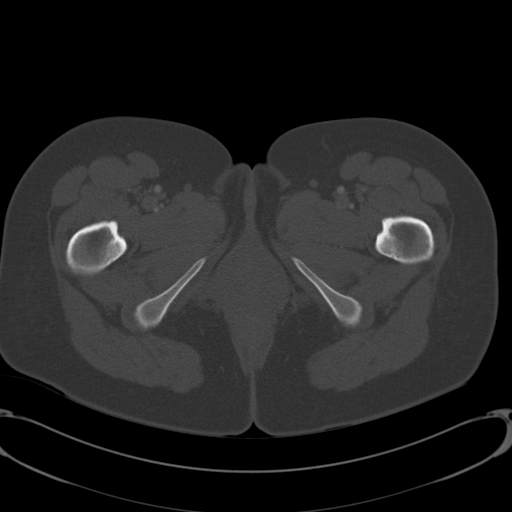
[im 18/162  soft-tissue]
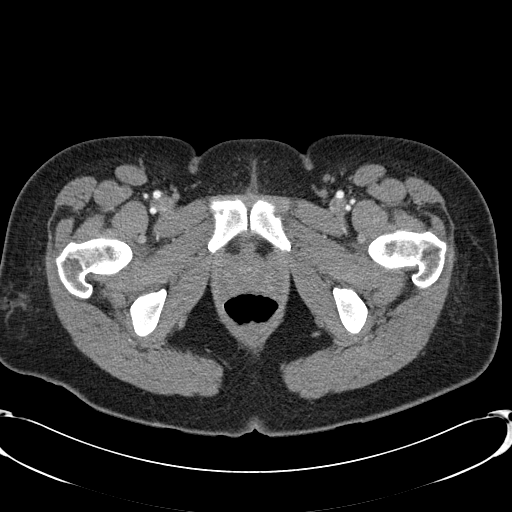
[im 36/162  soft-tissue]
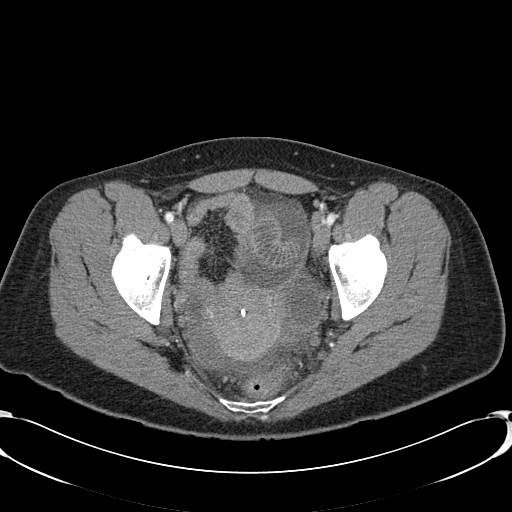
[im 45/162  soft-tissue]
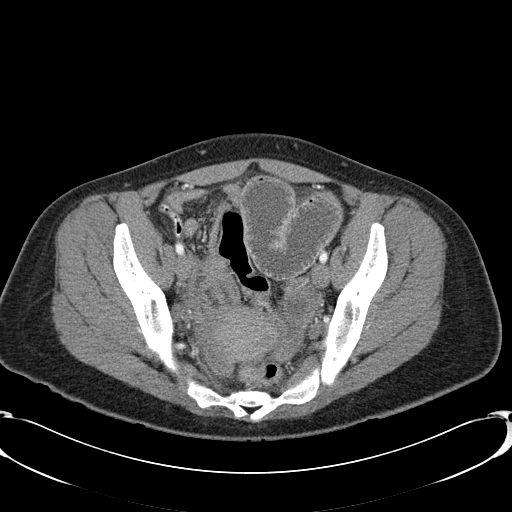
[im 54/162  soft-tissue]
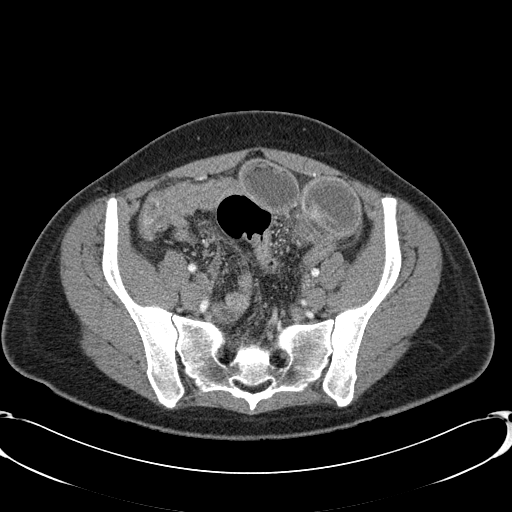
[im 63/162  soft-tissue]
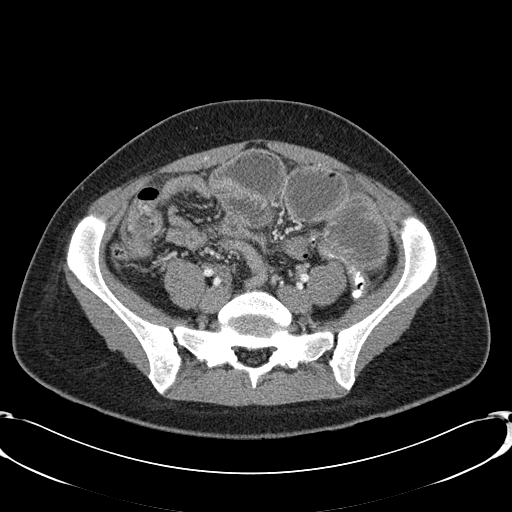
[im 72/162  soft-tissue]
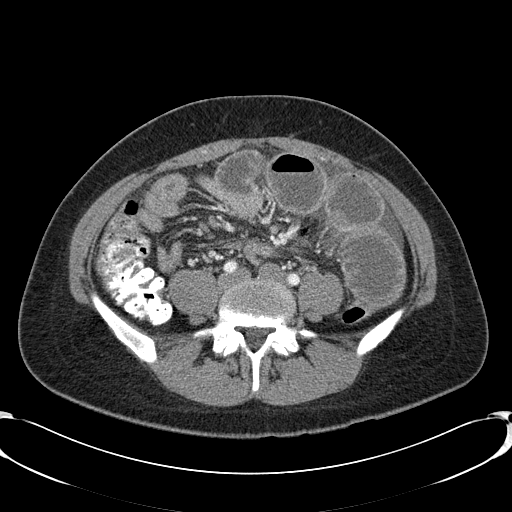
[im 90/162  soft-tissue]
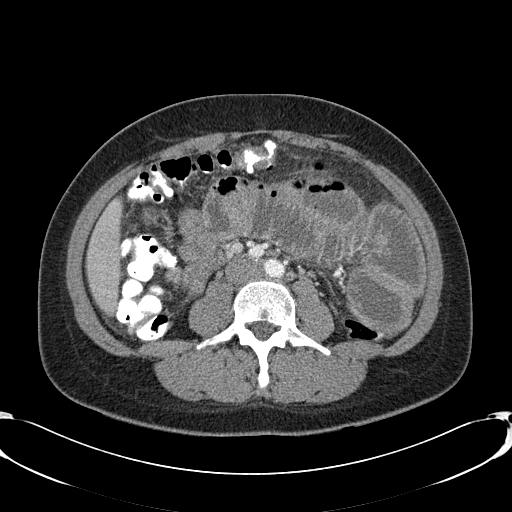
[im 99/162  soft-tissue]
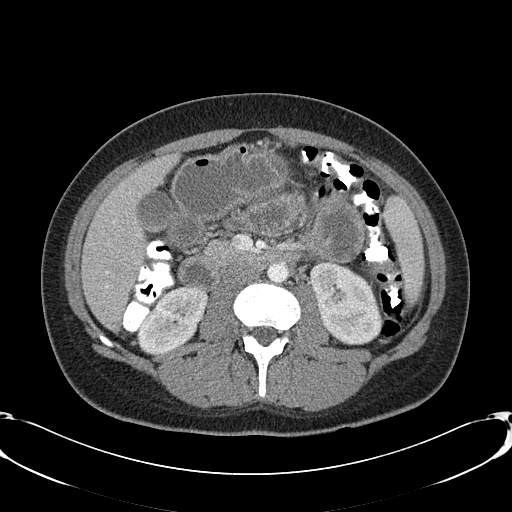
[im 99/162  bone]
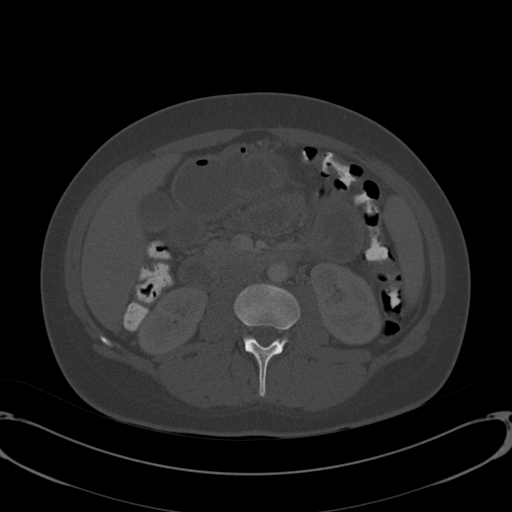
[im 108/162  soft-tissue]
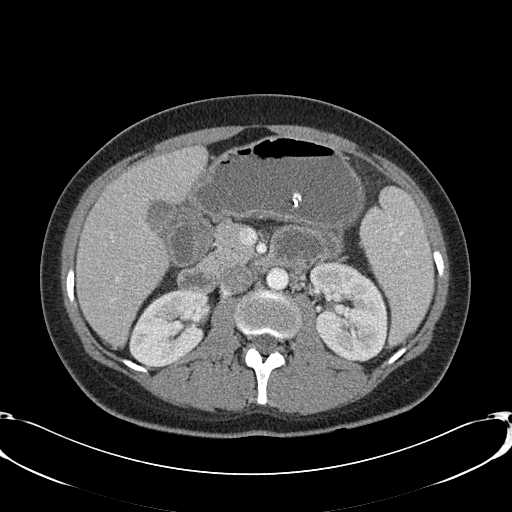
[im 117/162  soft-tissue]
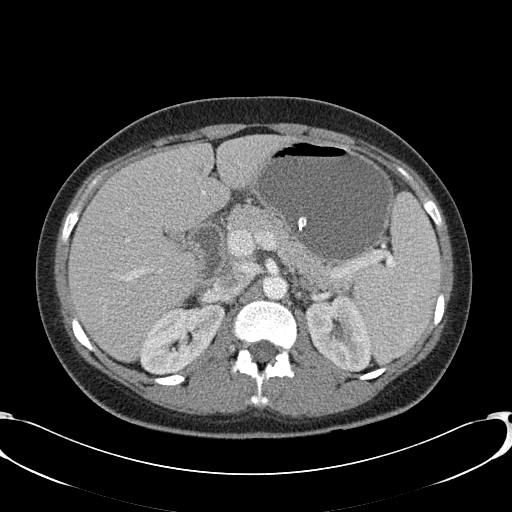
[im 126/162  soft-tissue]
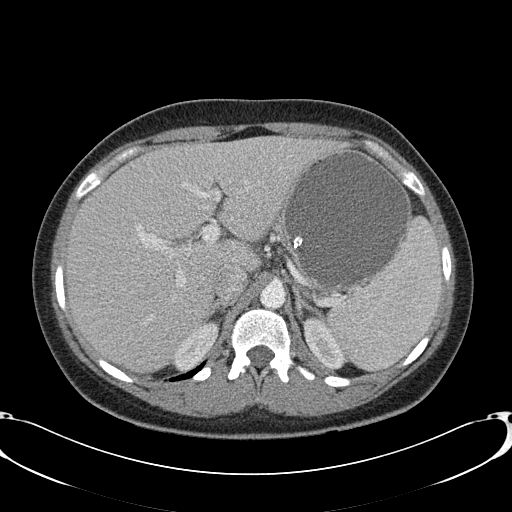
[im 144/162  soft-tissue]
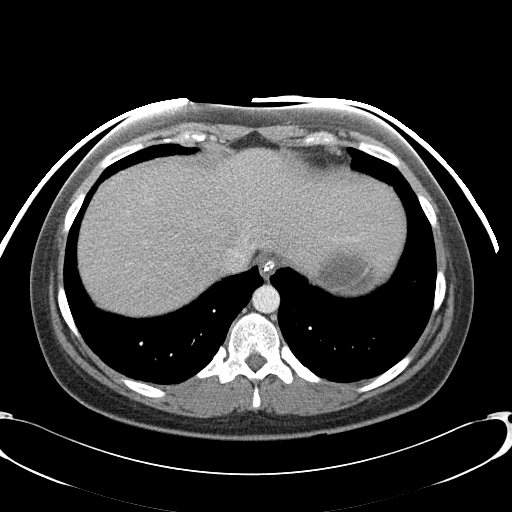
[im 153/162  soft-tissue]
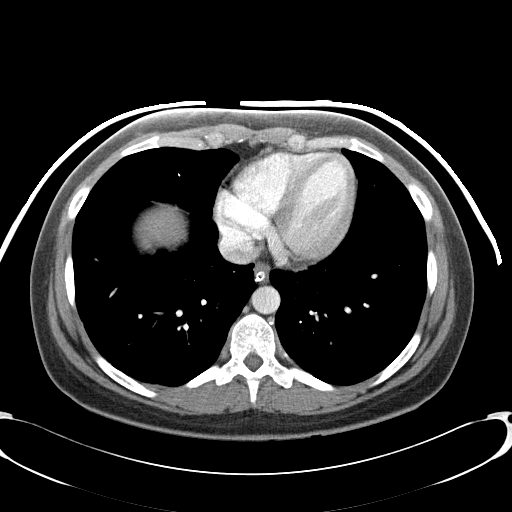

[Series 602: coronal abdomen · coronal · 0.95mm/px · 3 of 122 slices shown]
[im 41/122  soft-tissue]
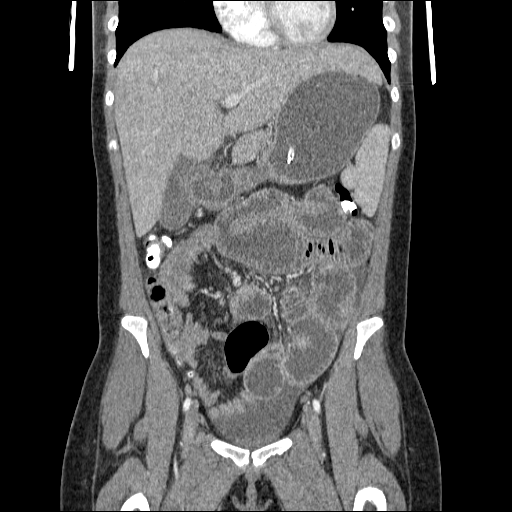
[im 54/122  soft-tissue]
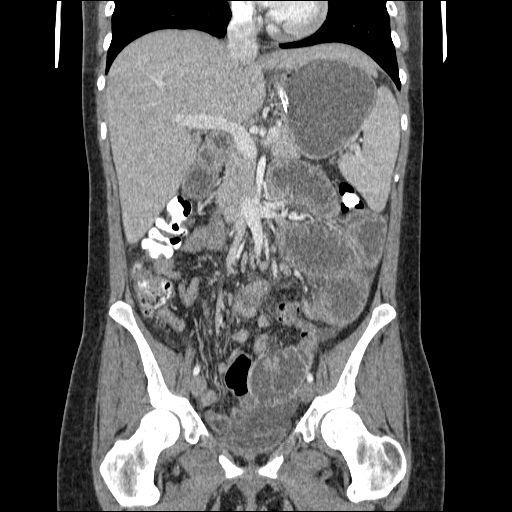
[im 68/122  soft-tissue]
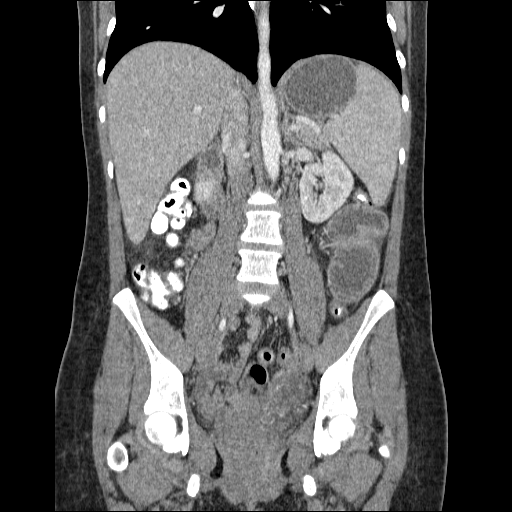

[17 of 46 positions shown; findings below may reference images not displayed]

FINDINGS: The lung bases are clear.

The solid abdominal organs are unremarkable and unchanged.

There is a NG tube in the stomach.  There are persistent dilated
jejunal loops of small bowel in the left upper quadrant and left
abdomen.  There appears to be a transition to nondilated slightly
thick-walled  ileal loops of small bowel in the mid pelvis.  No
findings for obstructing small bowel mass.  There is contrast
throughout the colon which is from a prior CT scan.  This would
suggest a partial small bowel obstruction. The appendix is normal.
The terminal ileum is normal.  There are a few small scattered
mesenteric and retroperitoneal lymph nodes but no adenopathy.  The
aorta is normal in caliber.  The major branch vessels are normal.

There is a small amount of free pelvic fluid and a stable left
ovarian cyst.  An intrauterine device is again demonstrated.  No
inguinal mass or hernia.  No abdominal wall hernia.
IMPRESSION: Persistent partial small bowel obstruction with apparent transition
in the mid pelvis without obvious cause.  If the patient has had
prior surgery this could be due to adhesions.  Otherwise an
internal hernia is possible.

## 2012-01-02 IMAGING — CR DG ABDOMEN 2V
2 series · 2 of 2 positions shown · non-contrast
Comparison: 01/29/2010

CLINICAL DATA: Follow-up small bowel obstruction.  Abdominal pain.
Nausea vomiting.

ABDOMEN - 2 VIEW

[w abdomen upright]
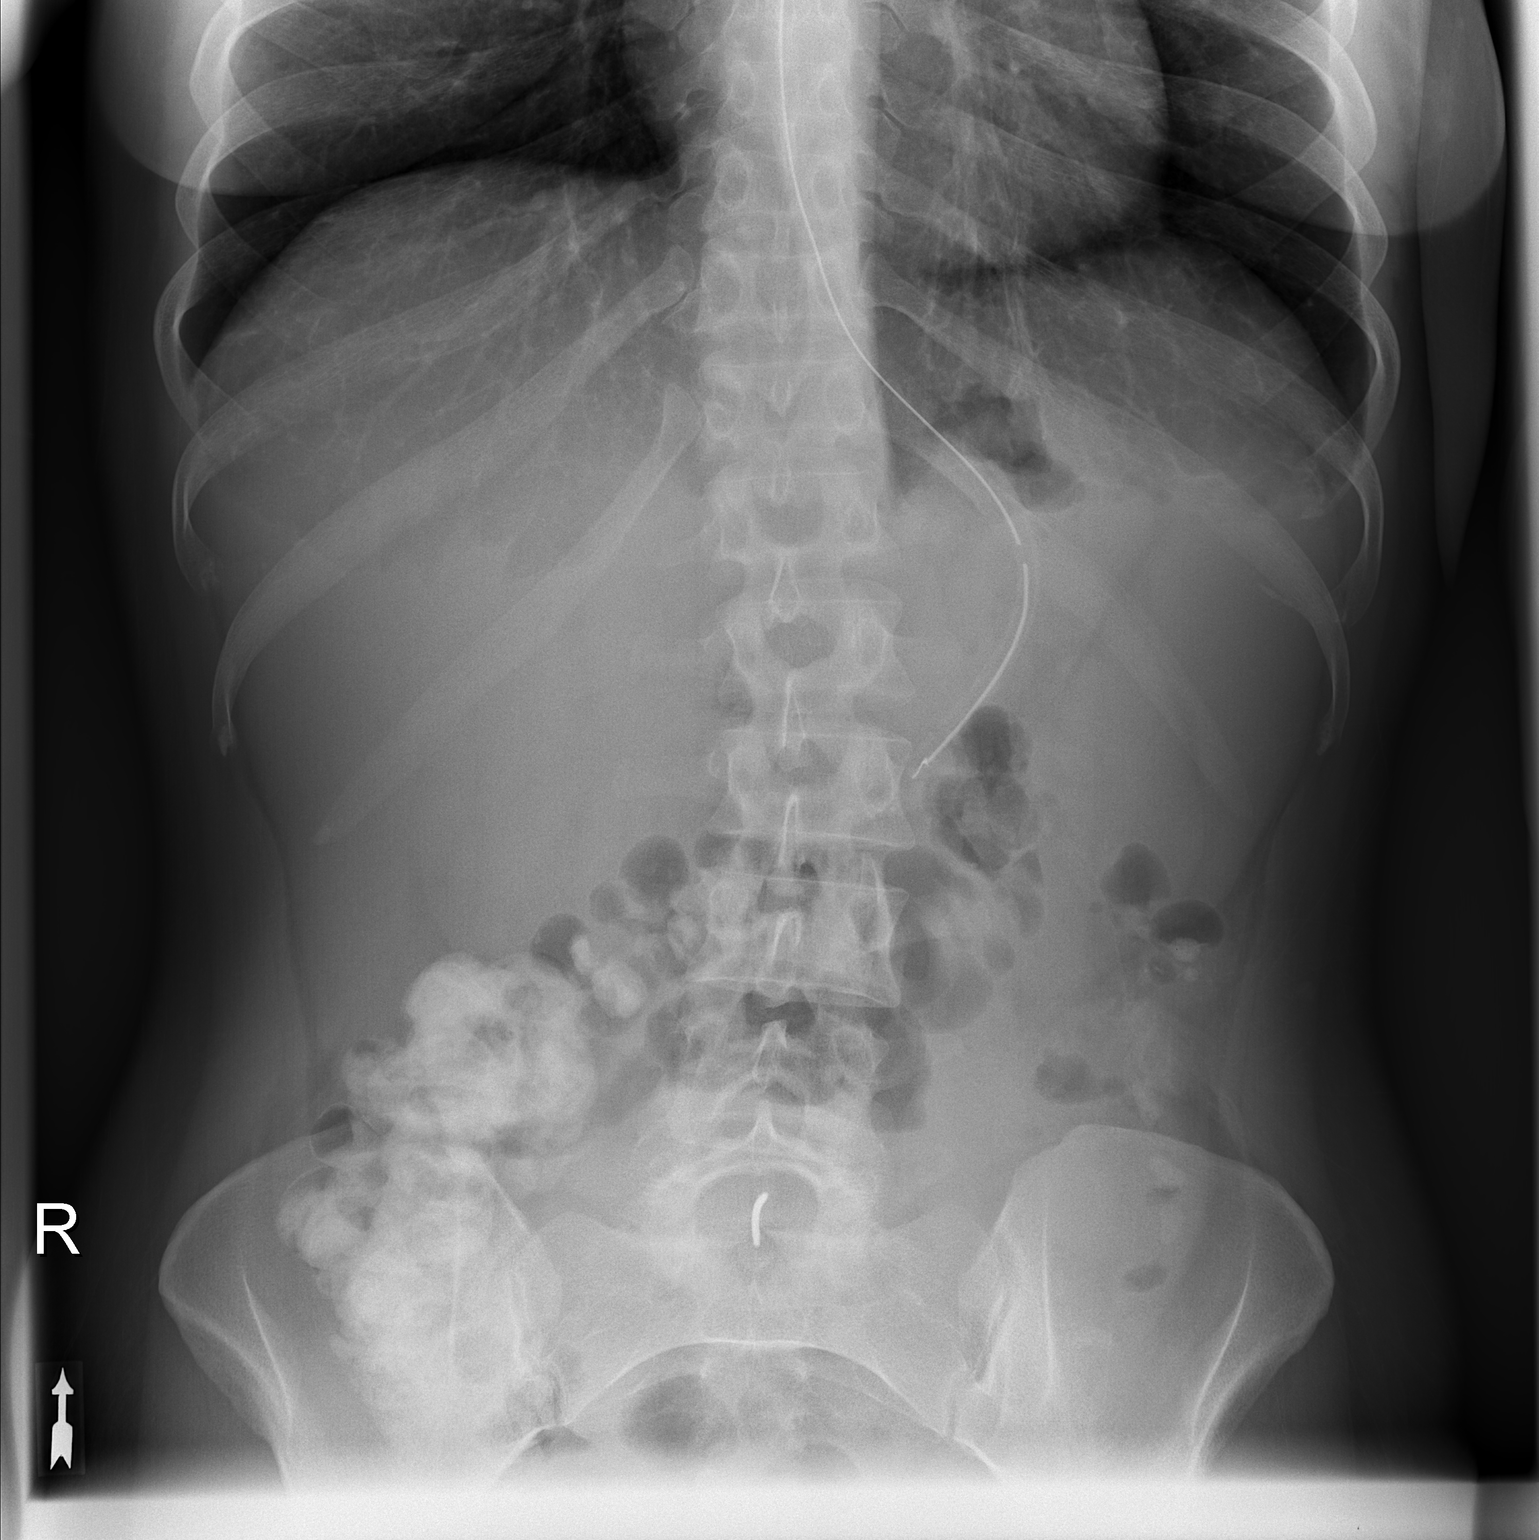

[t abdomen supine]
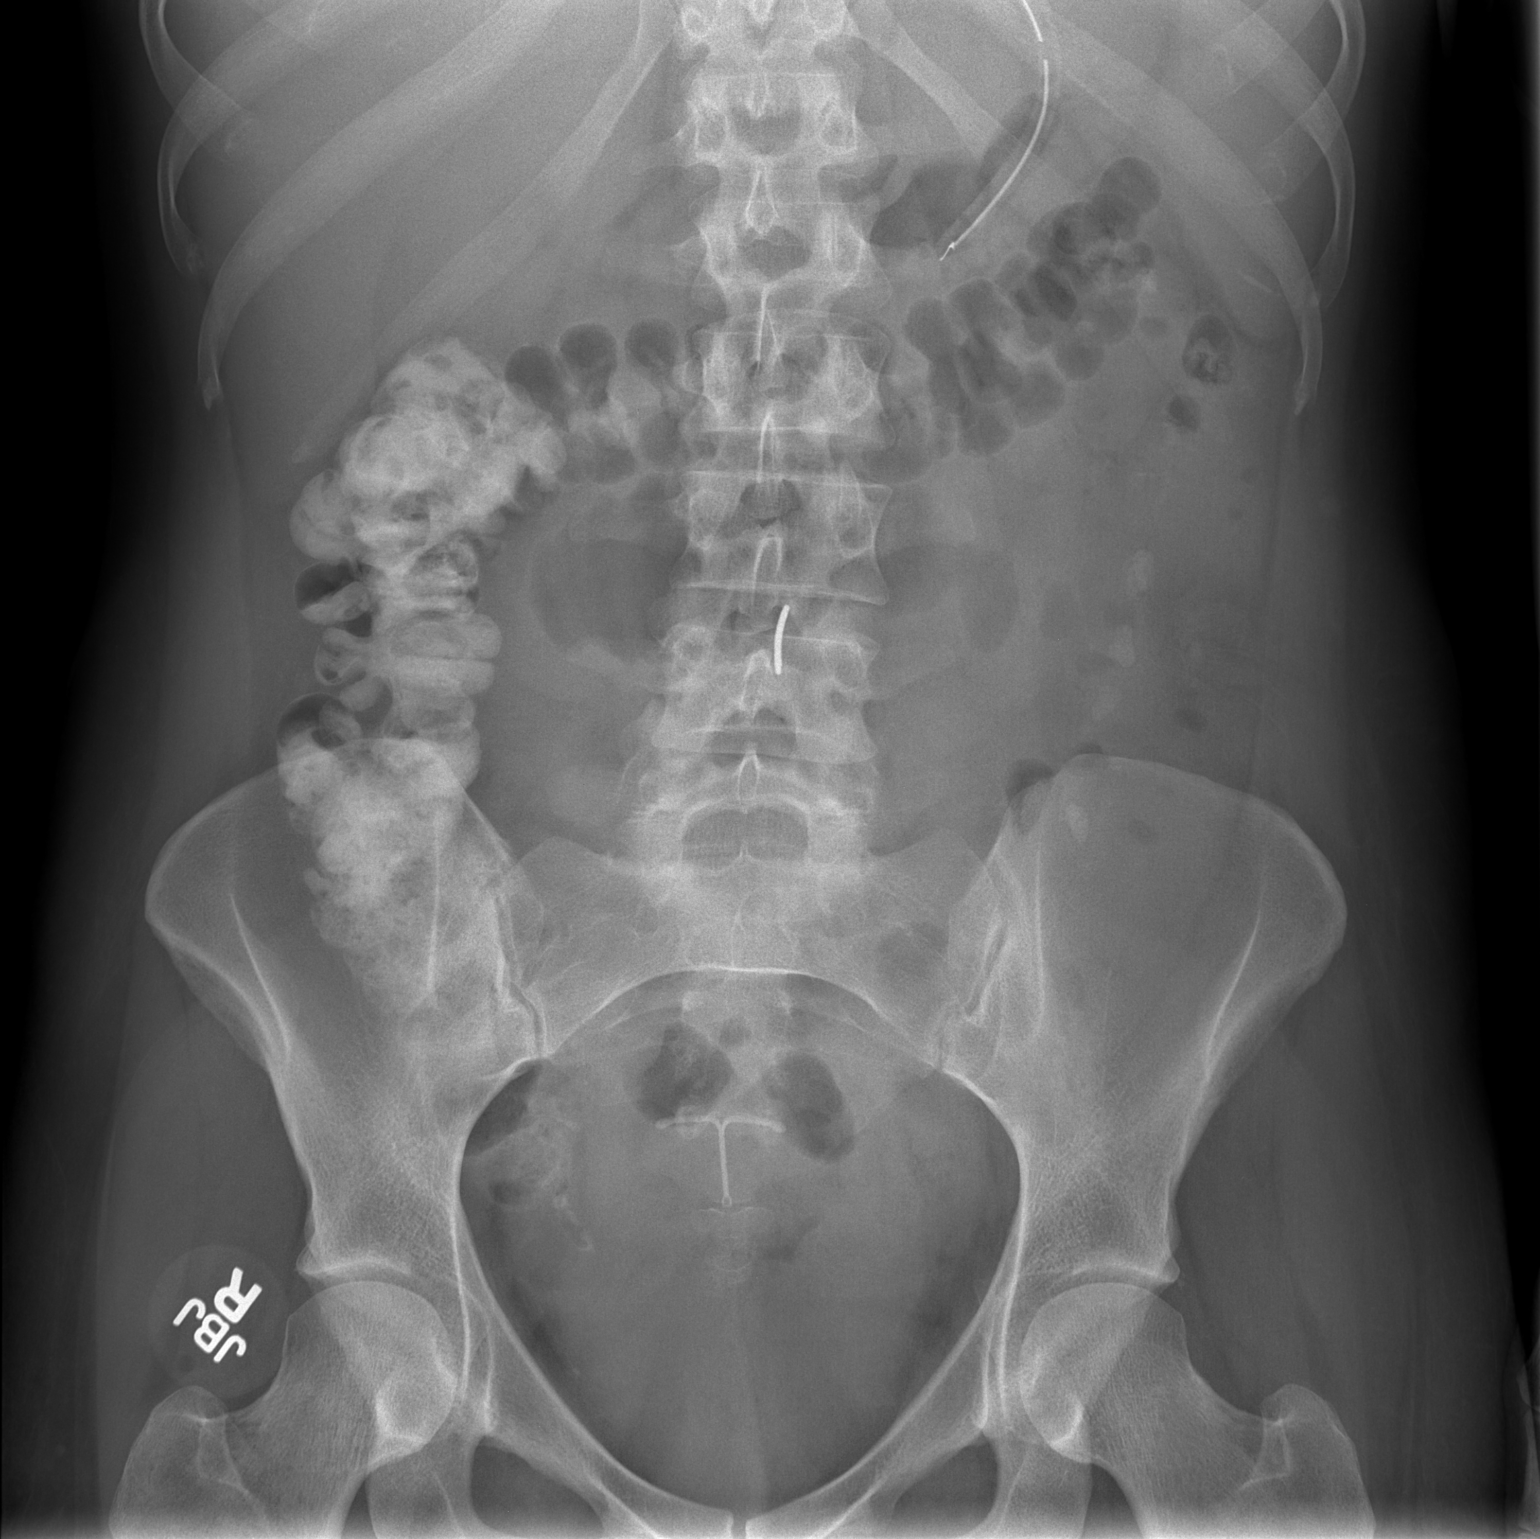

[2 of 2 positions shown; findings below may reference images not displayed]

FINDINGS: Nasogastric tube remains in the distal stomach.  Several
mildly dilated small bowel loops containing air fluid levels are
again seen in the central abdomen which show no significant change.
Residual contrast is again seen mainly in the right colon, there is
no evidence of colonic dilatation.  This remains suspicious for a
partial small bowel obstruction.  IUD again seen in the pelvis.  No
evidence of free air.
IMPRESSION: Stable bowel gas pattern suspicious for partial small bowel
obstruction.  No significant change compared to prior exam.

## 2012-02-23 ENCOUNTER — Emergency Department (HOSPITAL_BASED_OUTPATIENT_CLINIC_OR_DEPARTMENT_OTHER): Payer: Medicaid Other

## 2012-02-23 ENCOUNTER — Encounter (HOSPITAL_BASED_OUTPATIENT_CLINIC_OR_DEPARTMENT_OTHER): Payer: Self-pay | Admitting: Emergency Medicine

## 2012-02-23 ENCOUNTER — Emergency Department (HOSPITAL_BASED_OUTPATIENT_CLINIC_OR_DEPARTMENT_OTHER)
Admission: EM | Admit: 2012-02-23 | Discharge: 2012-02-23 | Disposition: A | Discharge status: Home / Self Care | Payer: Medicaid Other | Attending: Emergency Medicine | Admitting: Emergency Medicine

## 2012-02-23 DIAGNOSIS — L0291 Cutaneous abscess, unspecified: Secondary | ICD-10-CM

## 2012-02-23 DIAGNOSIS — F17200 Nicotine dependence, unspecified, uncomplicated: Secondary | ICD-10-CM | POA: Insufficient documentation

## 2012-02-23 DIAGNOSIS — Z8739 Personal history of other diseases of the musculoskeletal system and connective tissue: Secondary | ICD-10-CM | POA: Insufficient documentation

## 2012-02-23 DIAGNOSIS — L02419 Cutaneous abscess of limb, unspecified: Secondary | ICD-10-CM | POA: Insufficient documentation

## 2012-02-23 DIAGNOSIS — F341 Dysthymic disorder: Secondary | ICD-10-CM | POA: Insufficient documentation

## 2012-02-23 DIAGNOSIS — J189 Pneumonia, unspecified organism: Secondary | ICD-10-CM | POA: Insufficient documentation

## 2012-02-23 DIAGNOSIS — H00019 Hordeolum externum unspecified eye, unspecified eyelid: Secondary | ICD-10-CM | POA: Insufficient documentation

## 2012-02-23 DIAGNOSIS — M329 Systemic lupus erythematosus, unspecified: Secondary | ICD-10-CM | POA: Insufficient documentation

## 2012-02-23 LAB — CBC WITH DIFFERENTIAL/PLATELET
Eosinophils Absolute: 0.3 10*3/uL (ref 0.0–0.7)
Lymphs Abs: 1.9 10*3/uL (ref 0.7–4.0)
MCH: 29.1 pg (ref 26.0–34.0)
Neutrophils Relative %: 75 % (ref 43–77)
Platelets: 230 10*3/uL (ref 150–400)
RBC: 4.13 MIL/uL (ref 3.87–5.11)
WBC: 11.7 10*3/uL — ABNORMAL HIGH (ref 4.0–10.5)

## 2012-02-23 LAB — URINALYSIS, ROUTINE W REFLEX MICROSCOPIC
Leukocytes, UA: NEGATIVE
Nitrite: NEGATIVE
Specific Gravity, Urine: 1.013 (ref 1.005–1.030)
Urobilinogen, UA: 1 mg/dL (ref 0.0–1.0)

## 2012-02-23 LAB — BASIC METABOLIC PANEL
GFR calc Af Amer: >90 mL/min (ref >90)
GFR calc non Af Amer: >90 mL/min (ref >90)
Glucose, Bld: 120 mg/dL — ABNORMAL HIGH (ref 70–99)
Potassium: 4.2 mEq/L (ref 3.5–5.1)
Sodium: 136 mEq/L (ref 135–145)

## 2012-02-23 LAB — PREGNANCY, URINE: Preg Test, Ur: NEGATIVE

## 2012-02-23 LAB — D-DIMER, QUANTITATIVE: D-Dimer, Quant: 5.57 ug/mL-FEU — ABNORMAL HIGH (ref 0.00–0.48)

## 2012-02-23 MED ORDER — OXYCODONE-ACETAMINOPHEN 5-325 MG PO TABS: 2.0000 | ORAL_TABLET | ORAL | Status: DC | PRN

## 2012-02-23 MED ORDER — MORPHINE SULFATE 4 MG/ML IJ SOLN
4.0000 mg | Freq: Once | INTRAMUSCULAR | Status: AC
  Administered 2012-02-23: 4 mg via INTRAVENOUS
  Filled 2012-02-23: qty 1

## 2012-02-23 MED ORDER — IOHEXOL 350 MG/ML SOLN
100.0000 mL | Freq: Once | INTRAVENOUS | Status: AC | PRN
  Administered 2012-02-23: 100 mL via INTRAVENOUS

## 2012-02-23 MED ORDER — SULFAMETHOXAZOLE-TRIMETHOPRIM 800-160 MG PO TABS: 1.0000 | ORAL_TABLET | Freq: Two times a day (BID) | ORAL | Status: DC

## 2012-02-23 MED ORDER — SODIUM CHLORIDE 0.9 % IV BOLUS (SEPSIS)
1000.0000 mL | Freq: Once | INTRAVENOUS | Status: AC
  Administered 2012-02-23: 500 mL via INTRAVENOUS

## 2012-02-23 MED ORDER — AZITHROMYCIN 250 MG PO TABS: 250.0000 mg | ORAL_TABLET | Freq: Every day | ORAL | Status: DC

## 2012-02-23 MED ORDER — SODIUM CHLORIDE 0.9 % IV SOLN
Freq: Once | INTRAVENOUS | Status: AC
Start: 2012-02-23 — End: 2012-02-23
  Administered 2012-02-23: 11:00:00 via INTRAVENOUS

## 2012-02-23 MED ORDER — SULFAMETHOXAZOLE-TMP DS 800-160 MG PO TABS
1.0000 | ORAL_TABLET | Freq: Once | ORAL | Status: AC
Start: 2012-02-23 — End: 2012-02-23
  Administered 2012-02-23: 1 via ORAL
  Filled 2012-02-23: qty 1

## 2012-02-23 NOTE — ED Provider Notes (Addendum)
History     CSN: [id number]  Arrival date & time 02/23/12  1018   First MD Initiated Contact with Patient 02/23/12 1137      Chief Complaint  Patient presents with  . Joint Pain  . Chest Pain  . Stye  . Skin Problem    (Consider location/radiation/quality/duration/timing/severity/associated sxs/prior treatment) HPI Comments: PT presents with feeling that her lupus is flaring up. States that she has joint pains all over. She feels that typically when she has a lupus flareup she has an infection somewhere. She's had a mild cough and some mild burning in her mid chest area it's worse with coughing. She had some mild shortness of breath but denies any shortness of breath currently. She also has a small was started on her right thigh. She's had a history of multiple skin infections in the past. She has a small stye that's been going on about 2 days to her right thigh. She denies any known fevers or chills. Denies any nausea vomiting or diarrhea. She's been taking prednisone at home without relief.  Patient is a 31 y.o. female presenting with chest pain. The history is provided by the patient.  Chest Pain Primary symptoms include shortness of breath and cough. Pertinent negatives for primary symptoms include no fever, no fatigue, no abdominal pain, no nausea, no vomiting and no dizziness.  Pertinent negatives for associated symptoms include no diaphoresis, no numbness and no weakness.     Past Medical History  Diagnosis Date  . Anemia   . Anxiety   . Depression   . Arthritis     hands, feet, elbows, knees  . Lupus     Past Surgical History  Procedure Date  . Septoplasty 04/2005  . Diagnostic laparoscopy 01/2010    w/LOA  . Wisdom tooth extraction   . Svd     X 2  . Vaginal hysterectomy 08/13/2011    Procedure: HYSTERECTOMY VAGINAL;  Surgeon: Zenaida Niece, MD;  Location: [facility];  Service: Gynecology;  Laterality: N/A;  . Cystoscopy 08/13/2011    Procedure: CYSTOSCOPY;   Surgeon: Zenaida Niece, MD;  Location: [facility];  Service: Gynecology;  Laterality: N/A;    No family history on file.  History  Substance Use Topics  . Smoking status: Current Everyday Smoker -- 0.5 packs/day for 15 years    Types: Cigarettes  . Smokeless tobacco: Never Used  . Alcohol Use: No    OB History    Grav Para Term Preterm Abortions TAB SAB Ect Mult Living                  Review of Systems  Constitutional: Negative for fever, chills, diaphoresis and fatigue.  HENT: Negative for congestion, rhinorrhea and sneezing.   Eyes: Positive for redness. Negative for visual disturbance.  Respiratory: Positive for cough and shortness of breath. Negative for chest tightness.   Cardiovascular: Positive for chest pain. Negative for leg swelling.  Gastrointestinal: Negative for nausea, vomiting, abdominal pain, diarrhea and blood in stool.  Genitourinary: Negative for frequency, hematuria, flank pain and difficulty urinating.  Musculoskeletal: Positive for back pain and arthralgias.  Skin: Negative for rash.  Neurological: Negative for dizziness, speech difficulty, weakness, numbness and headaches.    Allergies  Iron  Home Medications   Current Outpatient Rx  Name Route Sig Dispense Refill  . AZITHROMYCIN 250 MG PO TABS Oral Take 1 tablet (250 mg total) by mouth daily. Take first 2 tablets together, then 1 every day  until finished. 6 tablet 0    BP 93/74  Pulse 68  Temp 98.2 F (36.8 C) (Oral)  Resp 18  Ht 5\' 9"  (1.753 m)  Wt 175 lb (79.379 kg)  BMI 25.84 kg/m2  SpO2 100%  LMP 07/29/2011  Physical Exam  Constitutional: She is oriented to person, place, and time. She appears well-developed and well-nourished.  HENT:  Head: Normocephalic and atraumatic.  Mouth/Throat: Oropharynx is clear and moist.  Eyes: EOM are normal. Pupils are equal, round, and reactive to light.       There is a very small stye on the right lower eyelid. No surrounding facial swelling is  noted  Neck: Normal range of motion. Neck supple.  Cardiovascular: Normal rate, regular rhythm and normal heart sounds.   Pulmonary/Chest: Effort normal and breath sounds normal. No respiratory distress. She has no wheezes. She has no rales. She exhibits tenderness (mild tenderness in mid chest area).  Abdominal: Soft. Bowel sounds are normal. There is no tenderness. There is no rebound and no guarding.  Musculoskeletal: Normal range of motion. She exhibits no edema.  Lymphadenopathy:    She has no cervical adenopathy.  Neurological: She is alert and oriented to person, place, and time.  Skin: Skin is warm and dry. No rash noted.       Small 2 cm area of erythema and some mild induration in the right inner thigh. No fluctuance is noted. No drainage  Psychiatric: She has a normal mood and affect.    ED Course  Procedures (including critical care time)  Results for orders placed during the hospital encounter of 02/23/12  CBC WITH DIFFERENTIAL      Component Value Range   WBC 11.7 (*) 4.0 - 10.5 K/uL   RBC 4.13  3.87 - 5.11 MIL/uL   Hemoglobin 12.0  12.0 - 15.0 g/dL   HCT 13.2  44.0 - 10.2 %   MCV 87.2  78.0 - 100.0 fL   MCH 29.1  26.0 - 34.0 pg   MCHC 33.3  30.0 - 36.0 g/dL   RDW 72.5  36.6 - 44.0 %   Platelets 230  150 - 400 K/uL   Neutrophils Relative 75  43 - 77 %   Neutro Abs 8.7 (*) 1.7 - 7.7 K/uL   Lymphocytes Relative 17  12 - 46 %   Lymphs Abs 1.9  0.7 - 4.0 K/uL   Monocytes Relative 6  3 - 12 %   Monocytes Absolute 0.7  0.1 - 1.0 K/uL   Eosinophils Relative 3  0 - 5 %   Eosinophils Absolute 0.3  0.0 - 0.7 K/uL   Basophils Relative 0  0 - 1 %   Basophils Absolute 0.0  0.0 - 0.1 K/uL  BASIC METABOLIC PANEL      Component Value Range   Sodium 136  135 - 145 mEq/L   Potassium 4.2  3.5 - 5.1 mEq/L   Chloride 102  96 - 112 mEq/L   CO2 22  19 - 32 mEq/L   Glucose, Bld 120 (*) 70 - 99 mg/dL   BUN 12  6 - 23 mg/dL   Creatinine, Ser 3.47  0.50 - 1.10 mg/dL   Calcium 9.6   8.4 - 42.5 mg/dL   GFR calc non Af Amer >90  >90 mL/min   GFR calc Af Amer >90  >90 mL/min  URINALYSIS, ROUTINE W REFLEX MICROSCOPIC      Component Value Range   Color, Urine YELLOW  YELLOW   APPearance CLEAR  CLEAR   Specific Gravity, Urine 1.013  1.005 - 1.030   pH 6.5  5.0 - 8.0   Glucose, UA NEGATIVE  NEGATIVE mg/dL   Hgb urine dipstick NEGATIVE  NEGATIVE   Bilirubin Urine NEGATIVE  NEGATIVE   Ketones, ur NEGATIVE  NEGATIVE mg/dL   Protein, ur NEGATIVE  NEGATIVE mg/dL   Urobilinogen, UA 1.0  0.0 - 1.0 mg/dL   Nitrite NEGATIVE  NEGATIVE   Leukocytes, UA NEGATIVE  NEGATIVE  PREGNANCY, URINE      Component Value Range   Preg Test, Ur NEGATIVE  NEGATIVE  D-DIMER, QUANTITATIVE      Component Value Range   D-Dimer, Quant 5.57 (*) 0.00 - 0.48 ug/mL-FEU   Dg Chest 2 View  02/23/2012  *RADIOLOGY REPORT*  Clinical Data: Cough and chest pain.  History of lupus.  CHEST - 2 VIEW  Comparison: 11/12/2010  Findings: Midline trachea.  Normal heart size and mediastinal contours. No pleural effusion or pneumothorax.  Minimal convex right thoracic spine curvature. Clear lungs.  IMPRESSION: No acute cardiopulmonary disease.  Original Report Authenticated By: Consuello Bossier, M.D.   Ct Angio Chest Pe W/cm &/or Wo Cm  02/23/2012  *RADIOLOGY REPORT*  Clinical Data: Chest pain, shortness of breath.  Rule out pulmonary embolus.  Midsternal and upper back pain since yesterday.  History of smoking, lupus.  CT ANGIOGRAPHY CHEST  Technique:  Multidetector CT imaging of the chest using the standard protocol during bolus administration of intravenous contrast. Multiplanar reconstructed images including MIPs were obtained and reviewed to evaluate the vascular anatomy.  Contrast: OMNIPAQUE IOHEXOL 350 MG/ML SOLN  Comparison: Chest x-ray 02/23/2012  Findings: The pulmonary arteries are well opacified.  There is no evidence for acute pulmonary embolus.  The heart size is normal. No mediastinal, hilar, or axillary  adenopathy.  There are patchy, peripheral scattered ground-glass opacities, primarily involving the upper lobes.  Appearance is nonspecific and reflects inflammatory or infectious process.  There are central lobular nodules, compatible with history of smoking.  There are no focal consolidations or pleural effusions.  Images of the upper abdomen are unremarkable.  The patient has a Schmorl's node at T6.  IMPRESSION:  1.  Technically adequate exam showing no evidence for acute pulmonary embolus. 2.  Nonspecific ground glass opacities within the upper lobes bilaterally, consistent with inflammatory or infectious process. 3.  Lung changes consistent with history of smoking.  Original Report Authenticated By: Patterson Hammersmith, M.D.     1. Stye   2. Abscess   3. Community acquired pneumonia       MDM  Patient here with multiple issues. She does have a small stye which I advised her to use warm compresses to. She has an early abscess on her right thigh which we will start her on Bactrim for I also advised warm compresses to this area. She's refusing to do an I&D at this point but I advised if he gets any bigger she's going to come back to have an I&D. She has an elevated d-dimer but no evidence of pulmonary embolus on CT scan. Although the CT scan does show some possible infectious bilateral process in the upper lobes and given her symptoms I will go ahead and treat her with Zithromax. I will bring her back tomorrow to have Doppler ultrasounds of her lower extremities given her markedly elevated d-dimer. I also advised her to followup with her primary care physician within 2 days for recheck  return here as needed for any worsening symptoms.        Rolan Bucco, MD 02/23/12 1459  Rolan Bucco, MD 02/23/12 1505

## 2012-02-23 NOTE — ED Notes (Signed)
Patient returned from CT

## 2012-02-23 NOTE — ED Notes (Signed)
Pt states she is having generalized joint pain, and started to have pain in upper chest and back yesterday.  Pt also c/o stye in right eye and possible boil/skin infection to right thigh.  Pt believes she is also having a flare up of her Lupus.

## 2012-02-24 ENCOUNTER — Ambulatory Visit (HOSPITAL_BASED_OUTPATIENT_CLINIC_OR_DEPARTMENT_OTHER)
Admit: 2012-02-24 | Discharge: 2012-02-24 | Disposition: A | Discharge status: Home / Self Care | Payer: Medicaid Other | Attending: Emergency Medicine | Admitting: Emergency Medicine

## 2012-02-24 DIAGNOSIS — R799 Abnormal finding of blood chemistry, unspecified: Secondary | ICD-10-CM | POA: Insufficient documentation

## 2012-02-24 DIAGNOSIS — I8000 Phlebitis and thrombophlebitis of superficial vessels of unspecified lower extremity: Secondary | ICD-10-CM | POA: Insufficient documentation

## 2012-02-24 DIAGNOSIS — M79609 Pain in unspecified limb: Secondary | ICD-10-CM | POA: Insufficient documentation

## 2012-02-24 MED ORDER — PREDNISONE 10 MG PO TABS: 10.0000 mg | ORAL_TABLET | Freq: Every day | ORAL | Status: DC

## 2012-02-26 ENCOUNTER — Inpatient Hospital Stay (HOSPITAL_BASED_OUTPATIENT_CLINIC_OR_DEPARTMENT_OTHER)
Admission: EM | Admit: 2012-02-26 | Discharge: 2012-02-28 | DRG: 603 | Disposition: A | Discharge status: Home / Self Care | Payer: Medicaid Other | Attending: Internal Medicine | Admitting: Internal Medicine

## 2012-02-26 ENCOUNTER — Encounter (HOSPITAL_BASED_OUTPATIENT_CLINIC_OR_DEPARTMENT_OTHER): Payer: Self-pay | Admitting: *Deleted

## 2012-02-26 ENCOUNTER — Emergency Department (HOSPITAL_BASED_OUTPATIENT_CLINIC_OR_DEPARTMENT_OTHER): Payer: Medicaid Other

## 2012-02-26 DIAGNOSIS — L0292 Furuncle, unspecified: Secondary | ICD-10-CM

## 2012-02-26 DIAGNOSIS — J069 Acute upper respiratory infection, unspecified: Secondary | ICD-10-CM

## 2012-02-26 DIAGNOSIS — L03115 Cellulitis of right lower limb: Secondary | ICD-10-CM

## 2012-02-26 DIAGNOSIS — J019 Acute sinusitis, unspecified: Secondary | ICD-10-CM

## 2012-02-26 DIAGNOSIS — M549 Dorsalgia, unspecified: Secondary | ICD-10-CM

## 2012-02-26 DIAGNOSIS — M545 Low back pain, unspecified: Secondary | ICD-10-CM

## 2012-02-26 DIAGNOSIS — F17200 Nicotine dependence, unspecified, uncomplicated: Secondary | ICD-10-CM

## 2012-02-26 DIAGNOSIS — M2550 Pain in unspecified joint: Secondary | ICD-10-CM

## 2012-02-26 DIAGNOSIS — F3289 Other specified depressive episodes: Secondary | ICD-10-CM | POA: Diagnosis present

## 2012-02-26 DIAGNOSIS — M329 Systemic lupus erythematosus, unspecified: Secondary | ICD-10-CM | POA: Diagnosis present

## 2012-02-26 DIAGNOSIS — L02419 Cutaneous abscess of limb, unspecified: Principal | ICD-10-CM | POA: Diagnosis present

## 2012-02-26 DIAGNOSIS — N920 Excessive and frequent menstruation with regular cycle: Secondary | ICD-10-CM

## 2012-02-26 DIAGNOSIS — F411 Generalized anxiety disorder: Secondary | ICD-10-CM | POA: Diagnosis present

## 2012-02-26 DIAGNOSIS — R609 Edema, unspecified: Secondary | ICD-10-CM

## 2012-02-26 DIAGNOSIS — M069 Rheumatoid arthritis, unspecified: Secondary | ICD-10-CM

## 2012-02-26 DIAGNOSIS — F329 Major depressive disorder, single episode, unspecified: Secondary | ICD-10-CM | POA: Diagnosis present

## 2012-02-26 LAB — BASIC METABOLIC PANEL
BUN: 11 mg/dL (ref 6–23)
Calcium: 9.4 mg/dL (ref 8.4–10.5)
GFR calc Af Amer: >90 mL/min (ref >90)
GFR calc non Af Amer: >90 mL/min (ref >90)
Potassium: 3.2 mEq/L — ABNORMAL LOW (ref 3.5–5.1)
Sodium: 136 mEq/L (ref 135–145)

## 2012-02-26 LAB — CBC
HCT: 34.1 % — ABNORMAL LOW (ref 36.0–46.0)
MCHC: 33.7 g/dL (ref 30.0–36.0)
RDW: 14.1 % (ref 11.5–15.5)

## 2012-02-26 LAB — DIFFERENTIAL
Basophils Absolute: 0 10*3/uL (ref 0.0–0.1)
Basophils Relative: 0 % (ref 0–1)
Eosinophils Relative: 1 % (ref 0–5)
Lymphocytes Relative: 28 % (ref 12–46)

## 2012-02-26 MED ORDER — HYDROMORPHONE HCL PF 1 MG/ML IJ SOLN
INTRAMUSCULAR | Status: AC
  Filled 2012-02-26: qty 1

## 2012-02-26 MED ORDER — HYDROMORPHONE HCL PF 1 MG/ML IJ SOLN
1.0000 mg | Freq: Once | INTRAMUSCULAR | Status: AC
  Administered 2012-02-26: 1 mg via INTRAVENOUS

## 2012-02-26 MED ORDER — HYDROMORPHONE HCL PF 1 MG/ML IJ SOLN
1.0000 mg | Freq: Once | INTRAMUSCULAR | Status: AC
  Administered 2012-02-26: 1 mg via INTRAVENOUS
  Filled 2012-02-26: qty 1

## 2012-02-26 MED ORDER — ONDANSETRON HCL 4 MG/2ML IJ SOLN
4.0000 mg | Freq: Once | INTRAMUSCULAR | Status: AC
  Administered 2012-02-26: 4 mg via INTRAVENOUS
  Filled 2012-02-26: qty 2

## 2012-02-26 MED ORDER — DEXTROSE 5 % IV SOLN
1.0000 g | INTRAVENOUS | Status: DC
  Administered 2012-02-26: 1 g via INTRAVENOUS

## 2012-02-26 MED ORDER — SODIUM CHLORIDE 0.9 % IV SOLN
Freq: Once | INTRAVENOUS | Status: AC
  Administered 2012-02-26: 20:00:00 via INTRAVENOUS

## 2012-02-26 MED ORDER — VANCOMYCIN HCL IN DEXTROSE 1-5 GM/200ML-% IV SOLN
1000.0000 mg | Freq: Once | INTRAVENOUS | Status: AC
  Administered 2012-02-26: 1000 mg via INTRAVENOUS
  Filled 2012-02-26: qty 200

## 2012-02-26 NOTE — ED Notes (Signed)
Pt reports she had bump on right inner leg since Sunday- seen Monday and told it was a "blood clot"- tonight presents with half dollar size open wound to right inner thigh with large area erythema- states "it popped"

## 2012-02-26 NOTE — ED Provider Notes (Signed)
History     CSN: [id number]  Arrival date & time 02/26/12  1906   First MD Initiated Contact with Patient 02/26/12 1950      Chief Complaint  Patient presents with  . Wound Infection    (Consider location/radiation/quality/duration/timing/severity/associated sxs/prior treatment) Patient is a 31 y.o. female presenting with leg pain. The history is provided by the patient. No language interpreter was used.  Leg Pain  The incident occurred more than 2 days ago. The incident occurred at home. There was no injury mechanism. The pain is present in the right thigh. The pain is at a severity of 10/10. The pain is severe. The pain has been constant since onset. Associated symptoms include inability to bear weight. She reports no foreign bodies present. Nothing aggravates the symptoms. The treatment provided no relief.   Pt was seen here 2 days ago.  Pt had an ultrasound of her leg.  Pt was treated with zithromax and bactrim for pneumonia found on a ct scan.   Pt reports now area is red and swollen on her right leg.  Pt complains of pain,  The area is oozing Past Medical History  Diagnosis Date  . Anemia   . Anxiety   . Depression   . Arthritis     hands, feet, elbows, knees  . Lupus     Past Surgical History  Procedure Date  . Septoplasty 04/2005  . Diagnostic laparoscopy 01/2010    w/LOA  . Wisdom tooth extraction   . Svd     X 2  . Vaginal hysterectomy 08/13/2011    Procedure: HYSTERECTOMY VAGINAL;  Surgeon: Zenaida Niece, MD;  Location: [facility];  Service: Gynecology;  Laterality: N/A;  . Cystoscopy 08/13/2011    Procedure: CYSTOSCOPY;  Surgeon: Zenaida Niece, MD;  Location: [facility];  Service: Gynecology;  Laterality: N/A;  . Abdominal hysterectomy     No family history on file.  History  Substance Use Topics  . Smoking status: Current Everyday Smoker -- 0.5 packs/day for 15 years    Types: Cigarettes  . Smokeless tobacco: Never Used  . Alcohol Use: No    OB History    Grav Para Term Preterm Abortions TAB SAB Ect Mult Living                  Review of Systems  Musculoskeletal: Positive for myalgias.  Skin: Positive for wound.  All other systems reviewed and are negative.    Allergies  Iron  Home Medications   Current Outpatient Rx  Name Route Sig Dispense Refill  . AZITHROMYCIN 250 MG PO TABS Oral Take 1 tablet (250 mg total) by mouth daily. Take first 2 tablets together, then 1 every day until finished. 6 tablet 0  . OXYCODONE-ACETAMINOPHEN 5-325 MG PO TABS Oral Take 2 tablets by mouth every 4 (four) hours as needed for pain. 15 tablet 0  . PREDNISONE 10 MG PO TABS Oral Take 1 tablet (10 mg total) by mouth daily. 15 tablet 0  . SULFAMETHOXAZOLE-TRIMETHOPRIM 800-160 MG PO TABS Oral Take 1 tablet by mouth every 12 (twelve) hours. 14 tablet 0    BP 123/76  Pulse 68  Temp 97.9 F (36.6 C) (Oral)  Resp 20  Ht 5\' 9"  (1.753 m)  Wt 175 lb (79.379 kg)  BMI 25.84 kg/m2  SpO2 100%  LMP 07/29/2011  Physical Exam  Vitals reviewed. Constitutional: She is oriented to person, place, and time. She appears well-developed and well-nourished.  HENT:  Head: Normocephalic.  Eyes: EOM are normal.  Neck: Normal range of motion.  Pulmonary/Chest: Effort normal.  Abdominal: She exhibits no distension.  Musculoskeletal: She exhibits tenderness.       Golf ball sized area dark red, with oozing center,  20x30 cm area of erythema.  Neurological: She is alert and oriented to person, place, and time.  Psychiatric: She has a normal mood and affect.    ED Course  Procedures (including critical care time)  Labs Reviewed  CBC - Abnormal; Notable for the following:    Hemoglobin 11.5 (*)     HCT 34.1 (*)     All other components within normal limits  BASIC METABOLIC PANEL - Abnormal; Notable for the following:    Potassium 3.2 (*)     All other components within normal limits  DIFFERENTIAL   No results found.   1. Cellulitis of right leg        MDM  I started Vancomycin,  Labs obtained.   Dr. Judd Lien in to see and examine.  Since pt is on 2 antibiotics outpatient she will need admission for treatment.          Lonia Skinner [place], [place] 02/26/12 2218

## 2012-02-27 ENCOUNTER — Encounter (HOSPITAL_COMMUNITY): Payer: Self-pay | Admitting: Internal Medicine

## 2012-02-27 DIAGNOSIS — L03119 Cellulitis of unspecified part of limb: Principal | ICD-10-CM

## 2012-02-27 DIAGNOSIS — M069 Rheumatoid arthritis, unspecified: Secondary | ICD-10-CM

## 2012-02-27 DIAGNOSIS — M549 Dorsalgia, unspecified: Secondary | ICD-10-CM

## 2012-02-27 DIAGNOSIS — R609 Edema, unspecified: Secondary | ICD-10-CM

## 2012-02-27 DIAGNOSIS — L03115 Cellulitis of right lower limb: Secondary | ICD-10-CM

## 2012-02-27 DIAGNOSIS — F17200 Nicotine dependence, unspecified, uncomplicated: Secondary | ICD-10-CM

## 2012-02-27 LAB — CBC WITH DIFFERENTIAL/PLATELET
Basophils Absolute: 0 10*3/uL (ref 0.0–0.1)
Basophils Relative: 0 % (ref 0–1)
HCT: 31.5 % — ABNORMAL LOW (ref 36.0–46.0)
MCHC: 33.7 g/dL (ref 30.0–36.0)
Monocytes Absolute: 0.3 10*3/uL (ref 0.1–1.0)
Neutro Abs: 3.6 10*3/uL (ref 1.7–7.7)
Platelets: 221 10*3/uL (ref 150–400)
RDW: 14.4 % (ref 11.5–15.5)

## 2012-02-27 LAB — COMPREHENSIVE METABOLIC PANEL
ALT: 7 U/L (ref 0–35)
Alkaline Phosphatase: 59 U/L (ref 39–117)
CO2: 23 mEq/L (ref 19–32)
Chloride: 107 mEq/L (ref 96–112)
GFR calc Af Amer: >90 mL/min (ref >90)
GFR calc non Af Amer: >90 mL/min (ref >90)
Glucose, Bld: 108 mg/dL — ABNORMAL HIGH (ref 70–99)
Potassium: 3.7 mEq/L (ref 3.5–5.1)
Sodium: 140 mEq/L (ref 135–145)
Total Bilirubin: 0.1 mg/dL — ABNORMAL LOW (ref 0.3–1.2)
Total Protein: 6.3 g/dL (ref 6.0–8.3)

## 2012-02-27 MED ORDER — PREDNISONE 10 MG PO TABS: 10.0000 mg | ORAL_TABLET | Freq: Every day | ORAL | Status: DC

## 2012-02-27 MED ORDER — HEPARIN SODIUM (PORCINE) 5000 UNIT/ML IJ SOLN
5000.0000 [IU] | Freq: Three times a day (TID) | INTRAMUSCULAR | Status: DC
  Administered 2012-02-27 – 2012-02-28 (×4): 5000 [IU] via SUBCUTANEOUS
  Filled 2012-02-27 (×7): qty 1

## 2012-02-27 MED ORDER — PREDNISONE 10 MG PO TABS
10.0000 mg | ORAL_TABLET | Freq: Every day | ORAL | Status: DC
  Administered 2012-02-27 – 2012-02-28 (×2): 10 mg via ORAL
  Filled 2012-02-27 (×3): qty 1

## 2012-02-27 MED ORDER — OXYCODONE-ACETAMINOPHEN 5-325 MG PO TABS
1.0000 | ORAL_TABLET | ORAL | Status: DC | PRN
  Administered 2012-02-27 – 2012-02-28 (×4): 1 via ORAL
  Filled 2012-02-27 (×4): qty 1

## 2012-02-27 MED ORDER — SODIUM CHLORIDE 0.9 % IV SOLN
INTRAVENOUS | Status: DC
  Administered 2012-02-27: 1000 mL via INTRAVENOUS
  Administered 2012-02-27 – 2012-02-28 (×2): via INTRAVENOUS

## 2012-02-27 MED ORDER — HYDROMORPHONE HCL PF 1 MG/ML IJ SOLN
1.0000 mg | INTRAMUSCULAR | Status: DC | PRN
  Administered 2012-02-27 – 2012-02-28 (×10): 1 mg via INTRAVENOUS
  Filled 2012-02-27 (×9): qty 1

## 2012-02-27 MED ORDER — VANCOMYCIN HCL IN DEXTROSE 1-5 GM/200ML-% IV SOLN
1000.0000 mg | Freq: Three times a day (TID) | INTRAVENOUS | Status: DC
  Administered 2012-02-27 – 2012-02-28 (×4): 1000 mg via INTRAVENOUS
  Filled 2012-02-27 (×6): qty 200

## 2012-02-27 MED ORDER — LEVOFLOXACIN IN D5W 500 MG/100ML IV SOLN
500.0000 mg | INTRAVENOUS | Status: DC
  Administered 2012-02-27 – 2012-02-28 (×2): 500 mg via INTRAVENOUS
  Filled 2012-02-27 (×2): qty 100

## 2012-02-27 MED ORDER — NICOTINE 21 MG/24HR TD PT24
21.0000 mg | MEDICATED_PATCH | Freq: Every day | TRANSDERMAL | Status: DC
  Administered 2012-02-27 – 2012-02-28 (×2): 21 mg via TRANSDERMAL
  Filled 2012-02-27 (×2): qty 1

## 2012-02-27 MED ORDER — ACETAMINOPHEN 650 MG RE SUPP: 650.0000 mg | Freq: Four times a day (QID) | RECTAL | Status: DC | PRN

## 2012-02-27 MED ORDER — ACETAMINOPHEN 325 MG PO TABS: 650.0000 mg | ORAL_TABLET | Freq: Four times a day (QID) | ORAL | Status: DC | PRN

## 2012-02-27 MED ORDER — ONDANSETRON HCL 4 MG/2ML IJ SOLN: 4.0000 mg | Freq: Four times a day (QID) | INTRAMUSCULAR | Status: DC | PRN

## 2012-02-27 MED ORDER — ONDANSETRON HCL 4 MG PO TABS: 4.0000 mg | ORAL_TABLET | Freq: Four times a day (QID) | ORAL | Status: DC | PRN

## 2012-02-27 NOTE — Plan of Care (Signed)
Problem: Phase I Progression Outcomes Goal: Initial discharge plan identified Outcome: Completed/Met Date Met:  02/27/12 To return home when medically stable.

## 2012-02-27 NOTE — H&P (Signed)
[name] is an 31 y.o. female.   Patient was seen and examined on February 27, 2012 at 2:30 AM. PCP - Dr. Cyndia Bent at [facility]. Rheumatologist - used to be Dr. Ancil Linsey in [place]. Chief Complaint: Right thigh pain and swelling. HPI: 31 year-old female with history of lupus versus seronegative rheumatoid arthritis and tobacco abuse had originally come to the ER Sunday that is 4 days ago with complaints of shortness of breath. At that time patient had CT angiogram of the chest which was negative for any PE and was diagnosed with possible pneumonia. Patient complained of pain in the right thigh and sonogram was done which showed only superficial thrombophlebitis. Patient was discharged home on oral antibiotics. Patient's right thigh swelling slowly increased in size and started having discharge and patient came to the ER again yesterday. At this time patient has been admitted for further management. Patient has been started antibiotics for possible cellulitis. Patient denies any trauma or insect bite to the area. Patient has subjective feeling of fever and chills. Denies any shortness of breath or chest pain at this time.  Past Medical History  Diagnosis Date  . Anemia   . Anxiety   . Depression   . Arthritis     hands, feet, elbows, knees  . Lupus     Past Surgical History  Procedure Date  . Septoplasty 04/2005  . Diagnostic laparoscopy 01/2010    w/LOA  . Wisdom tooth extraction   . Svd     X 2  . Vaginal hysterectomy 08/13/2011    Procedure: HYSTERECTOMY VAGINAL;  Surgeon: Zenaida Niece, MD;  Location: [facility];  Service: Gynecology;  Laterality: N/A;  . Cystoscopy 08/13/2011    Procedure: CYSTOSCOPY;  Surgeon: Zenaida Niece, MD;  Location: [facility];  Service: Gynecology;  Laterality: N/A;  . Abdominal hysterectomy     Family History  Problem Relation Age of Onset  . Rheum arthritis Other    Social History:  reports that she has been smoking Cigarettes.   She has a 7.5 pack-year smoking history. She has never used smokeless tobacco. She reports that she does not drink alcohol or use illicit drugs.  Allergies:  Allergies  Allergen Reactions  . Iron Anaphylaxis    Pt states that she had anaphylaxis to IV iron    Medications Prior to Admission  Medication Sig Dispense Refill  . azithromycin (ZITHROMAX) 250 MG tablet Take 250 mg by mouth daily. Take first 2 tablets together, then 1 every day until finished.      . oxyCODONE-acetaminophen (PERCOCET/ROXICET) 5-325 MG per tablet Take 1 tablet by mouth every 4 (four) hours as needed. For pain.      . predniSONE (DELTASONE) 10 MG tablet Take 10 mg by mouth daily.      Marland Kitchen sulfamethoxazole-trimethoprim (BACTRIM DS,SEPTRA DS) 800-160 MG per tablet Take 1 tablet by mouth every 12 (twelve) hours.        Results for orders placed during the hospital encounter of 02/26/12 (from the past 48 hour(s))  CBC     Status: Abnormal   Collection Time   02/26/12  7:39 PM      Component Value Range Comment   WBC 10.2  4.0 - 10.5 K/uL    RBC 3.96  3.87 - 5.11 MIL/uL    Hemoglobin 11.5 (*) 12.0 - 15.0 g/dL    HCT 78.4 (*) 69.6 - 46.0 %    MCV 86.1  78.0 - 100.0 fL  MCH 29.0  26.0 - 34.0 pg    MCHC 33.7  30.0 - 36.0 g/dL    RDW 30.8  65.7 - 84.6 %    Platelets 232  150 - 400 K/uL   DIFFERENTIAL     Status: Normal   Collection Time   02/26/12  7:39 PM      Component Value Range Comment   Neutrophils Relative 66  43 - 77 %    Neutro Abs 6.7  1.7 - 7.7 K/uL    Lymphocytes Relative 28  12 - 46 %    Lymphs Abs 2.8  0.7 - 4.0 K/uL    Monocytes Relative 5  3 - 12 %    Monocytes Absolute 0.5  0.1 - 1.0 K/uL    Eosinophils Relative 1  0 - 5 %    Eosinophils Absolute 0.1  0.0 - 0.7 K/uL    Basophils Relative 0  0 - 1 %    Basophils Absolute 0.0  0.0 - 0.1 K/uL   BASIC METABOLIC PANEL     Status: Abnormal   Collection Time   02/26/12  8:04 PM      Component Value Range Comment   Sodium 136  135 - 145 mEq/L     Potassium 3.2 (*) 3.5 - 5.1 mEq/L    Chloride 101  96 - 112 mEq/L    CO2 20  19 - 32 mEq/L    Glucose, Bld 95  70 - 99 mg/dL    BUN 11  6 - 23 mg/dL    Creatinine, Ser 9.62  0.50 - 1.10 mg/dL    Calcium 9.4  8.4 - 95.2 mg/dL    GFR calc non Af Amer >90  >90 mL/min    GFR calc Af Amer >90  >90 mL/min    Dg Femur Right  02/26/2012  *RADIOLOGY REPORT*  Clinical Data: Right distal thigh abscess.  No known injury.  RIGHT FEMUR - 2 VIEW  Comparison: None.  Findings: A metallic BB was placed over the palpable concern anteriorly in the distal thigh.  No underlying foreign body or soft tissue emphysema is seen.  There is no evidence of acute fracture, dislocation or bone destruction.  IMPRESSION: No radiographic abnormality.   Original Report Authenticated By: Gerrianne Scale, M.D.     Review of Systems  Constitutional: Positive for fever and chills.  HENT: Negative.   Eyes: Negative.   Respiratory: Negative.   Cardiovascular: Negative.   Gastrointestinal: Negative.   Genitourinary: Negative.   Musculoskeletal:       Pain and swelling of the right thigh.  Skin: Negative.   Neurological: Negative.   Endo/Heme/Allergies: Negative.   Psychiatric/Behavioral: Negative.     Blood pressure 102/72, pulse 69, temperature 97.8 F (36.6 C), temperature source Oral, resp. rate 18, height 5\' 9"  (1.753 m), weight 79.379 kg (175 lb), last menstrual period 07/29/2011, SpO2 100.00%. Physical Exam  Constitutional: She is oriented to person, place, and time. She appears well-developed and well-nourished. No distress.  HENT:  Head: Normocephalic and atraumatic.  Right Ear: External ear normal.  Left Ear: External ear normal.  Nose: Nose normal.  Mouth/Throat: Oropharynx is clear and moist. No oropharyngeal exudate.  Eyes: Conjunctivae are normal. Pupils are equal, round, and reactive to light. Right eye exhibits no discharge. Left eye exhibits no discharge. No scleral icterus.  Neck: Normal range of  motion. Neck supple.  Cardiovascular: Normal rate and regular rhythm.   Respiratory: Effort normal and breath sounds  normal. No respiratory distress. She has no wheezes. She has no rales.  GI: Soft. Bowel sounds are normal. She exhibits no distension. There is no tenderness. There is no rebound and no guarding.  Musculoskeletal:       Right thigh on the medial aspect has an erythematous area which is tender with a central punctum draining serosanguinous fluid. Area is around 10 cm diameter.  Neurological: She is alert and oriented to person, place, and time.       Moves all extremities.  Skin: She is not diaphoretic.  Psychiatric: Her behavior is normal.     Assessment/Plan #1. Possible cellulitis of the right thigh - patient has been started on empiric antibiotics vancomycin and Levaquin. I have also ordered MRI of the right thigh. Patient will be placed on when necessary pain relief medications. Not sure if it is definitely cellulitis or an inflammatory lesion related to her autoimmune condition. May need biopsy or debridement(depending on MRI findings). #2. 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 I have ordered C3, C4 levels, anti-double-stranded DNA levels to see if there is any flareup for possible lupus.  #3. Tobacco abuse - patient advised to quit smoking. #4. Recent CT angiogram of the chest showing nonspecific upper lobe findings concerning for inflammation or infectious process - presently patient is not short of breath or denies any chest pain. Probably will need pulmonary followup. #5. Admitted last year for bowel obstruction requiring adhesion lysis.  CODE STATUS - full code.  Tylon Kemmerling N. 02/27/2012, 2:43 AM

## 2012-02-27 NOTE — Progress Notes (Signed)
ANTIBIOTIC CONSULT NOTE - INITIAL  Pharmacy Consult for vancomycin + levaquin Indication: cellulitis  Allergies  Allergen Reactions  . Iron Anaphylaxis    Pt states that she had anaphylaxis to IV iron    Patient Measurements: Height: 5\' 9"  (175.3 cm) Weight: 175 lb (79.379 kg) IBW/kg (Calculated) : 66.2   Vital Signs: Temp: 97.8 F (36.6 C) (08/22 0036) Temp src: Oral (08/22 0036) BP: 102/72 mmHg (08/22 0036) Pulse Rate: 69  (08/22 0036) Intake/Output from previous day:   Intake/Output from this shift:    Labs:  [facility] 02/26/12 2004 02/26/12 1939  WBC -- 10.2  HGB -- 11.5*  PLT -- 232  LABCREA -- --  CREATININE 0.80 --   Estimated Creatinine Clearance: 106.5 ml/min (by C-G formula based on Cr of 0.8). No results found for this basename: VANCOTROUGH:2,VANCOPEAK:2,VANCORANDOM:2,GENTTROUGH:2,GENTPEAK:2,GENTRANDOM:2,TOBRATROUGH:2,TOBRAPEAK:2,TOBRARND:2,AMIKACINPEAK:2,AMIKACINTROU:2,AMIKACIN:2, in the last 72 hours   Microbiology: No results found for this or any previous visit (from the past 720 hour(s)).  Medical History: Past Medical History  Diagnosis Date  . Anemia   . Anxiety   . Depression   . Arthritis     hands, feet, elbows, knees  . Lupus     Medications:  Prescriptions prior to admission  Medication Sig Dispense Refill  . azithromycin (ZITHROMAX) 250 MG tablet Take 250 mg by mouth daily. Take first 2 tablets together, then 1 every day until finished.      . oxyCODONE-acetaminophen (PERCOCET/ROXICET) 5-325 MG per tablet Take 1 tablet by mouth every 4 (four) hours as needed. For pain.      . predniSONE (DELTASONE) 10 MG tablet Take 10 mg by mouth daily.      Marland Kitchen sulfamethoxazole-trimethoprim (BACTRIM DS,SEPTRA DS) 800-160 MG per tablet Take 1 tablet by mouth every 12 (twelve) hours.       Assessment: 43 yof presented to the ED with leg pain. She has been undergoing treatment for pneumonia and was on azithromycin + bactrim PTA. She has received 1gm  ceftriaxone + 1gm vancomycin in the ED. Now will continue on vancomycin + levaquin for treatment of cellulitis. She is afebrile and her WBC is WNL. Of note, she has been on vancomycin in the past and achieved a therapeutic trough of 13.8 on a regimen of 1gm IV Q8H.   Goal of Therapy:  Vancomycin trough level 10-15 mcg/ml  Plan:  1. Levaquin 500mg  IV Q24H 2. Vancomycin 1gm IV Q8H 3. F/u renal function, C&S and trough at [facility]  Taelon Bendorf, Drake Leach 02/27/2012,2:57 AM

## 2012-02-27 NOTE — Progress Notes (Signed)
[facility] PROGRESS NOTE  [name] UJW:[id number] DOB: 03/16/1981 DOA: 02/26/2012   Assessment/Plan: Patient Active Hospital Problem List: Cellulitis of right leg (02/27/2012) -patient has remained afebrile, Doppler of the lower extremities was negative for any DVTs, cytosis resolved.   TOBACCO ABUSE (04/13/2009)  -counseling.  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  Code Status: Full code Family Communication: Patient Disposition Plan: home     LOS: 1 day   Procedures:  doppler  Antibiotics:  Vancomycin    Subjective: patient wants to go home   Objective: Filed Vitals:   02/26/12 1926 02/26/12 2252 02/27/12 0036 02/27/12 0507  BP: 123/76 111/83 102/72 103/69  Pulse: 68 61 69 64  Temp: 97.9 F (36.6 C) 97.8 F (36.6 C) 97.8 F (36.6 C) 97.5 F (36.4 C)  TempSrc: Oral Oral Oral Oral  Resp: 20 16 18 14   Height: 5\' 9"  (1.753 m)     Weight: 79.379 kg (175 lb)     SpO2: 100% 100% 100% 100%   No intake or output data in the 24 hours ending 02/27/12 0750 Weight change:   Exam:  General: Alert, awake, oriented x3, in no acute distress.  HEENT: No bruits, no goiter.  Heart: Regular rate and rhythm, without murmurs, rubs, gallops.  Lungs: Good air movement, bilateral air movement.  Abdomen: Soft, nontender, nondistended, positive bowel sounds.  Extremity: redness is slightly improved. Boil is purulent and draining.no fluctating mass.   Data Reviewed: Basic Metabolic Panel:  Lab 02/27/12 5621 02/26/12 2004 02/23/12 1105  NA 140 136 136  K 3.7 3.2* --  CL 107 101 102  CO2 23 20 22   GLUCOSE 108* 95 120*  BUN 9 11 12   CREATININE 0.65 0.80 0.80  CALCIUM 8.7 9.4 9.6  MG -- -- --  PHOS -- -- --   Liver Function Tests:  Lab 02/27/12 0555  AST 9  ALT 7  ALKPHOS 59  BILITOT 0.1*  PROT 6.3  ALBUMIN 2.9*   No results found for this basename: LIPASE:5,AMYLASE:5 in the last 168 hours No results found for  this basename: AMMONIA:5 in the last 168 hours CBC:  Lab 02/27/12 0555 02/26/12 1939 02/23/12 1105  WBC 6.7 10.2 11.7*  NEUTROABS 3.6 6.7 8.7*  HGB 10.6* 11.5* 12.0  HCT 31.5* 34.1* 36.0  MCV 87.7 86.1 87.2  PLT 221 232 230   Cardiac Enzymes: No results found for this basename: CKTOTAL:5,CKMB:5,CKMBINDEX:5,TROPONINI:5 in the last 168 hours BNP: No components found with this basename: POCBNP:5 CBG: No results found for this basename: GLUCAP:5 in the last 168 hours  No results found for this or any previous visit (from the past 240 hour(s)).   Studies: Dg Chest 2 View  02/23/2012  *RADIOLOGY REPORT*  Clinical Data: Cough and chest pain.  History of lupus.  CHEST - 2 VIEW  Comparison: 11/12/2010  Findings: Midline trachea.  Normal heart size and mediastinal contours. No pleural effusion or pneumothorax.  Minimal convex right thoracic spine curvature. Clear lungs.  IMPRESSION: No acute cardiopulmonary disease.  Original Report Authenticated By: Consuello Bossier, M.D.   Dg Femur Right  02/26/2012  *RADIOLOGY REPORT*  Clinical Data: Right distal thigh abscess.  No known injury.  RIGHT FEMUR - 2 VIEW  Comparison: None.  Findings: A metallic BB was placed over the palpable concern anteriorly in the distal thigh.  No underlying foreign body or soft tissue emphysema is seen.  There is no evidence of acute fracture, dislocation or bone destruction.  IMPRESSION:  No radiographic abnormality.   Original Report Authenticated By: Gerrianne Scale, M.D.    Ct Angio Chest Pe W/cm &/or Wo Cm  02/23/2012  *RADIOLOGY REPORT*  Clinical Data: Chest pain, shortness of breath.  Rule out pulmonary embolus.  Midsternal and upper back pain since yesterday.  History of smoking, lupus.  CT ANGIOGRAPHY CHEST  Technique:  Multidetector CT imaging of the chest using the standard protocol during bolus administration of intravenous contrast. Multiplanar reconstructed images including MIPs were obtained and reviewed to  evaluate the vascular anatomy.  Contrast: OMNIPAQUE IOHEXOL 350 MG/ML SOLN  Comparison: Chest x-ray 02/23/2012  Findings: The pulmonary arteries are well opacified.  There is no evidence for acute pulmonary embolus.  The heart size is normal. No mediastinal, hilar, or axillary adenopathy.  There are patchy, peripheral scattered ground-glass opacities, primarily involving the upper lobes.  Appearance is nonspecific and reflects inflammatory or infectious process.  There are central lobular nodules, compatible with history of smoking.  There are no focal consolidations or pleural effusions.  Images of the upper abdomen are unremarkable.  The patient has a Schmorl's node at T6.  IMPRESSION:  1.  Technically adequate exam showing no evidence for acute pulmonary embolus. 2.  Nonspecific ground glass opacities within the upper lobes bilaterally, consistent with inflammatory or infectious process. 3.  Lung changes consistent with history of smoking.  Original Report Authenticated By: Patterson Hammersmith, M.D.   US Venous Img Lower Bilateral  02/24/2012  *RADIOLOGY REPORT*  Clinical Data: Right leg pain, erythema, and palpable lump. Elevated D-dimer.  BILATERAL LOWER EXTREMITY VENOUS DUPLEX ULTRASOUND  Technique:  Gray-scale sonography with graded compression, as well as color Doppler and duplex ultrasound, were performed to evaluate the deep venous system of both lower extremities from the level of the common femoral vein through the popliteal and proximal calf veins.  Spectral Doppler was utilized to evaluate flow at rest and with distal augmentation maneuvers.  Comparison:  None.  Findings:  Normal compressibility of bilateral common femoral, superficial femoral, and popliteal veins is demonstrated, as well as the visualized proximal calf veins.  No filling defects to suggest DVT on grayscale or color Doppler imaging.  Doppler waveforms show normal direction of venous flow, normal respiratory phasicity and  response to augmentation.  In the inferior and medial right thigh in the area of the the palpable abnormality and erythema, there is thrombosis of a superficial vein.  This is consistent with superficial thrombophlebitis.  IMPRESSION:  1.  No evidence of deep vein thrombosis in either lower extremity. 2.  Superficial thrombophlebitis in the inferior and medial right thigh which corresponds to the area of clinical concern.   Original Report Authenticated By: Danae Orleans, M.D. ( 02/24/2012 08:49:15 )     Scheduled Meds:   . sodium chloride   Intravenous Once  .  HYDROmorphone (DILAUDID) injection  1 mg Intravenous Once  . HYDROmorphone  1 mg Intravenous Once  . HYDROmorphone  1 mg Intravenous Once  . levofloxacin (LEVAQUIN) IV  500 mg Intravenous Q24H  . ondansetron  4 mg Intravenous Once  . predniSONE  10 mg Oral Q breakfast  . vancomycin  1,000 mg Intravenous Once  . vancomycin  1,000 mg Intravenous Q8H  . DISCONTD: cefTRIAXone (ROCEPHIN)  IV  1 g Intravenous Q24H  . DISCONTD: predniSONE  10 mg Oral Daily   Continuous Infusions:   . sodium chloride 100 mL/hr at 02/27/12 0257    Lambert Keto, MD  [facility] Pager [phone]  If 7PM-7AM, please contact night-coverage [email or web address] Password [facility] 02/27/2012, 7:50 AM

## 2012-02-27 NOTE — Care Management Note (Signed)
    Page 1 of 1   02/28/2012     11:21:33 AM   CARE MANAGEMENT NOTE 02/28/2012  Patient:  [name], [name]   Account Number:  [id number]  Date Initiated:  02/27/2012  Documentation initiated by:  Letha Cape  Subjective/Objective Assessment:   dx cellulitis of right leg  admit- lives with friend. pta independent.     Action/Plan:   Anticipated DC Date:  02/28/2012   Anticipated DC Plan:  HOME/SELF CARE      DC Planning Services  CM consult      Choice offered to / List presented to:             Status of service:  Completed, signed off Medicare Important Message given?   (If response is "NO", the following Medicare IM given date fields will be blank) Date Medicare IM given:   Date Additional Medicare IM given:    Discharge Disposition:  HOME/SELF CARE  Per UR Regulation:  Reviewed for med. necessity/level of care/duration of stay  If discussed at Long Length of Stay Meetings, dates discussed:    Comments:  PCP Larita Fife- but she has not seen him since she has no insurance,  I called  [facility] office and they state they can not see patient will make appt with Jovita Kussmaul- apt 8/29 at 3pm.  02/28/12 11:20 Letha Cape RN, BSN [phone] patient for dc today, has follow up apt with Jovita Kussmaul.  02/27/12 16:06 Letha Cape RN, BSN [phone] patient lives alone, pta independent.  Patient is eligible for med ast if needed.  Patient states she can afford abx on $4 list at [facility] and she has prednisone at home just had that filled, but if she has to get anything other med that is not on $4 list she will need assitance.  Patient states she is in process of getting Medicaid and she has transportation.  NCM will continue to follow for dc needs.

## 2012-02-28 ENCOUNTER — Inpatient Hospital Stay (HOSPITAL_COMMUNITY): Payer: Medicaid Other

## 2012-02-28 DIAGNOSIS — L0293 Carbuncle, unspecified: Secondary | ICD-10-CM

## 2012-02-28 MED ORDER — HYDROMORPHONE HCL PF 1 MG/ML IJ SOLN
INTRAMUSCULAR | Status: AC
  Filled 2012-02-28: qty 1

## 2012-02-28 MED ORDER — SULFAMETHOXAZOLE-TMP DS 800-160 MG PO TABS
1.0000 | ORAL_TABLET | Freq: Two times a day (BID) | ORAL | Status: DC
  Administered 2012-02-28: 1 via ORAL
  Filled 2012-02-28 (×2): qty 1

## 2012-02-28 MED ORDER — OXYCODONE-ACETAMINOPHEN 5-325 MG PO TABS: 2.0000 | ORAL_TABLET | ORAL | Status: AC | PRN

## 2012-02-28 MED ORDER — NICOTINE 21 MG/24HR TD PT24: 1.0000 | MEDICATED_PATCH | Freq: Every day | TRANSDERMAL | Status: AC

## 2012-02-28 MED ORDER — SULFAMETHOXAZOLE-TMP DS 800-160 MG PO TABS: 1.0000 | ORAL_TABLET | Freq: Two times a day (BID) | ORAL | Status: AC

## 2012-02-28 MED ORDER — GADOBENATE DIMEGLUMINE 529 MG/ML IV SOLN
15.0000 mL | Freq: Once | INTRAVENOUS | Status: AC
  Administered 2012-02-28: 15 mL via INTRAVENOUS

## 2012-02-28 NOTE — Discharge Summary (Signed)
Physician Discharge Summary  [name] QMV:[id number] DOB: 12-24-1980 DOA: 02/26/2012  PCP: Rogelia Boga, MD  Admit date: 02/26/2012 Discharge date: 02/28/2012  Discharge Diagnoses:  Principal Problem:  *Cellulitis of right leg Active Problems:  TOBACCO ABUSE  RHEUMATOID ARTHRITIS, SERONEGATIVE   Discharge Condition: Stable for DC  Disposition:   Diet: Regular diet Wt Readings from Last 3 Encounters:  02/26/12 79.379 kg (175 lb)  02/23/12 79.379 kg (175 lb)  08/13/11 81.647 kg (180 lb)    History of present illness:  31 year-old female with history of lupus versus seronegative rheumatoid arthritis and tobacco abuse had originally come to the ER Sunday that is 4 days ago with complaints of shortness of breath. At that time patient had CT angiogram of the chest which was negative for any PE and was diagnosed with possible pneumonia. Patient complained of pain in the right thigh and sonogram was done which showed only superficial thrombophlebitis. Patient was discharged home on oral antibiotics. Patient's right thigh swelling slowly increased in size and started having discharge and patient came to the ER again yesterday. At this time patient has been admitted for further management. Patient has been started antibiotics for possible cellulitis. Patient denies any trauma or insect bite to the area. Patient has subjective feeling of fever and chills. Denies any shortness of breath or chest pain at this time.   Hospital Course:  Principal Problem:  *Cellulitis of right leg: She was admitted to the hospital start on IV neck MRI of the leg was done that showed only superficial abscess, cultures continue to be negative at the time of this dictation, Doppler and Motrin he was done which was negative CT injected this was done which was negative for PE. So she was discharged on Bactrim for for 2 week course.  TOBACCO ABUSE -Counseling she was given nicotine patch.  RHEUMATOID  ARTHRITIS, SERONEGATIVE -No changes were made continue prednisone.  Discharge Exam: Filed Vitals:   02/28/12 0615  BP: 99/50  Pulse: 64  Temp: 97.5 F (36.4 C)  Resp: 14   Filed Vitals:   02/27/12 0507 02/27/12 1403 02/27/12 2125 02/28/12 0615  BP: 103/69 111/54 110/65 99/50  Pulse: 64 62 60 64  Temp: 97.5 F (36.4 C) 97.8 F (36.6 C) 97.7 F (36.5 C) 97.5 F (36.4 C)  TempSrc: Oral Tympanic Oral Oral  Resp: 14 16 14 14   Height:      Weight:      SpO2: 100% 100% 100% 100%   General: Awake alert and oriented times Cardiovascular: Regular rate and rhythm Respiratory: Good air movement clear to auscultation  Discharge Instructions  Discharge Orders    Future Orders Please Complete By Expires   Diet - low sodium heart healthy      Increase activity slowly        Medication List  As of 02/28/2012 10:30 AM   STOP taking these medications         azithromycin 250 MG tablet         TAKE these medications         nicotine 21 mg/24hr patch   Commonly known as: NICODERM CQ - dosed in mg/24 hours   Place 1 patch onto the skin daily.      oxyCODONE-acetaminophen 5-325 MG per tablet   Commonly known as: PERCOCET/ROXICET   Take 2 tablets by mouth every 4 (four) hours as needed for pain.      predniSONE 10 MG tablet   Commonly known as:  DELTASONE   Take 10 mg by mouth daily.      sulfamethoxazole-trimethoprim 800-160 MG per tablet   Commonly known as: BACTRIM DS   Take 1 tablet by mouth every 12 (twelve) hours.              The results of significant diagnostics from this hospitalization (including imaging, microbiology, ancillary and laboratory) are listed below for reference.    Significant Diagnostic Studies: Dg Chest 2 View  02/23/2012  *RADIOLOGY REPORT*  Clinical Data: Cough and chest pain.  History of lupus.  CHEST - 2 VIEW  Comparison: 11/12/2010  Findings: Midline trachea.  Normal heart size and mediastinal contours. No pleural effusion or  pneumothorax.  Minimal convex right thoracic spine curvature. Clear lungs.  IMPRESSION: No acute cardiopulmonary disease.  Original Report Authenticated By: Consuello Bossier, M.D.   Dg Femur Right  02/26/2012  *RADIOLOGY REPORT*  Clinical Data: Right distal thigh abscess.  No known injury.  RIGHT FEMUR - 2 VIEW  Comparison: None.  Findings: A metallic BB was placed over the palpable concern anteriorly in the distal thigh.  No underlying foreign body or soft tissue emphysema is seen.  There is no evidence of acute fracture, dislocation or bone destruction.  IMPRESSION: No radiographic abnormality.   Original Report Authenticated By: Gerrianne Scale, M.D.    Ct Angio Chest Pe W/cm &/or Wo Cm  02/23/2012  *RADIOLOGY REPORT*  Clinical Data: Chest pain, shortness of breath.  Rule out pulmonary embolus.  Midsternal and upper back pain since yesterday.  History of smoking, lupus.  CT ANGIOGRAPHY CHEST  Technique:  Multidetector CT imaging of the chest using the standard protocol during bolus administration of intravenous contrast. Multiplanar reconstructed images including MIPs were obtained and reviewed to evaluate the vascular anatomy.  Contrast: OMNIPAQUE IOHEXOL 350 MG/ML SOLN  Comparison: Chest x-ray 02/23/2012  Findings: The pulmonary arteries are well opacified.  There is no evidence for acute pulmonary embolus.  The heart size is normal. No mediastinal, hilar, or axillary adenopathy.  There are patchy, peripheral scattered ground-glass opacities, primarily involving the upper lobes.  Appearance is nonspecific and reflects inflammatory or infectious process.  There are central lobular nodules, compatible with history of smoking.  There are no focal consolidations or pleural effusions.  Images of the upper abdomen are unremarkable.  The patient has a Schmorl's node at T6.  IMPRESSION:  1.  Technically adequate exam showing no evidence for acute pulmonary embolus. 2.  Nonspecific ground glass opacities  within the upper lobes bilaterally, consistent with inflammatory or infectious process. 3.  Lung changes consistent with history of smoking.  Original Report Authenticated By: Patterson Hammersmith, M.D.   Mr Femur Right W Wo Contrast  02/28/2012  *RADIOLOGY REPORT*  Clinical Data:  Right thigh pain and swelling.  MRI RIGHT THIGH WITH AND WITHOUT CONTRAST  Technique:  Multiplanar, multisequence MR imaging of the right thigh was performed before and after the administration of intravenous contrast.  Contrast: 15mL MULTIHANCE GADOBENATE DIMEGLUMINE 529 MG/ML IV SOLN  Comparison:  Plain films 02/26/2012.  Right lower extremity ultrasound 02/24/2012.  Findings: The coronal and axial sequences incidentally include the left thigh.  There is subcutaneous edema and enhancement in the medial right thigh extending approximately 22 cm cranial-caudal.  A small fluid collection extending to the skin surface measures 1.1 cm cranial-caudal by 1.3 cm transverse by 1.3 cm A P and is compatible with an abscess or phlegmon.  This collection is centered 12 cm  above the medial joint line of the knee.  Its appearance suggests it is visible at the skin surface.  No other fluid collection is identified.  Visualized musculature demonstrates normal signal.  There is no marrow signal abnormality to suggest osteomyelitis.  The left thigh appears normal.  IMPRESSION: Findings consistent with cellulitis of the medial right thigh. Small phlegmon versus abscess distally appears to extend to the skin surface and should be visible.  There is no deep abscess and no evidence of osteomyelitis.   Original Report Authenticated By: Bernadene Bell. Maricela Curet, M.D.    US Venous Img Lower Bilateral  02/24/2012  *RADIOLOGY REPORT*  Clinical Data: Right leg pain, erythema, and palpable lump. Elevated D-dimer.  BILATERAL LOWER EXTREMITY VENOUS DUPLEX ULTRASOUND  Technique:  Gray-scale sonography with graded compression, as well as color Doppler and duplex  ultrasound, were performed to evaluate the deep venous system of both lower extremities from the level of the common femoral vein through the popliteal and proximal calf veins.  Spectral Doppler was utilized to evaluate flow at rest and with distal augmentation maneuvers.  Comparison:  None.  Findings:  Normal compressibility of bilateral common femoral, superficial femoral, and popliteal veins is demonstrated, as well as the visualized proximal calf veins.  No filling defects to suggest DVT on grayscale or color Doppler imaging.  Doppler waveforms show normal direction of venous flow, normal respiratory phasicity and response to augmentation.  In the inferior and medial right thigh in the area of the the palpable abnormality and erythema, there is thrombosis of a superficial vein.  This is consistent with superficial thrombophlebitis.  IMPRESSION:  1.  No evidence of deep vein thrombosis in either lower extremity. 2.  Superficial thrombophlebitis in the inferior and medial right thigh which corresponds to the area of clinical concern.   Original Report Authenticated By: Danae Orleans, M.D. ( 02/24/2012 08:49:15 )     Microbiology: Recent Results (from the past 240 hour(s))  WOUND CULTURE     Status: Normal (Preliminary result)   Collection Time   02/27/12  2:19 AM      Component Value Range Status Comment   Specimen Description LEG RIGHT   Final    Special Requests Normal   Final    Gram Stain     Final    Value: NO WBC SEEN     NO SQUAMOUS EPITHELIAL CELLS SEEN     NO ORGANISMS SEEN   Culture PENDING   Incomplete    Report Status PENDING   Incomplete      Labs: Basic Metabolic Panel:  Lab 02/27/12 3086 02/26/12 2004 02/23/12 1105  NA 140 136 136  K 3.7 3.2* --  CL 107 101 102  CO2 23 20 22   GLUCOSE 108* 95 120*  BUN 9 11 12   CREATININE 0.65 0.80 0.80  CALCIUM 8.7 9.4 9.6  MG -- -- --  PHOS -- -- --   Liver Function Tests:  Lab 02/27/12 0555  AST 9  ALT 7  ALKPHOS 59  BILITOT  0.1*  PROT 6.3  ALBUMIN 2.9*   No results found for this basename: LIPASE:5,AMYLASE:5 in the last 168 hours No results found for this basename: AMMONIA:5 in the last 168 hours CBC:  Lab 02/27/12 0555 02/26/12 1939 02/23/12 1105  WBC 6.7 10.2 11.7*  NEUTROABS 3.6 6.7 8.7*  HGB 10.6* 11.5* 12.0  HCT 31.5* 34.1* 36.0  MCV 87.7 86.1 87.2  PLT 221 232 230   Cardiac Enzymes: No results  found for this basename: CKTOTAL:5,CKMB:5,CKMBINDEX:5,TROPONINI:5 in the last 168 hours BNP: No components found with this basename: POCBNP:5 CBG: No results found for this basename: GLUCAP:5 in the last 168 hours  Time coordinating discharge: 35 minute  Signed:  Marinda Elk  [facility] 02/28/2012, 10:30 AM

## 2012-02-28 NOTE — ED Provider Notes (Signed)
Medical screening examination/treatment/procedure(s) were conducted as a shared visit with non-physician practitioner(s) and myself.  I personally evaluated the patient during the encounter.  The patient presents to the ER with complaints of pain, swelling, and infection in the right thigh.  She was seen here and put on zithromax and doxycycline, however she has been improving despite three days of this.  On exam, the patient is tearful and appears uncomfortable.  She is otherwise afebrile and vitals are stable.  The heart and lung exam is unremarkable.  The right thigh is noted to have a large area of swelling, induration with significant surrounding erythema.  It appears as though she is failing oral outpatient therapy and I believe requires admission.  K. Keenan Bachelor has spoken with [facility] who agrees to admit the patient to their service.  She was given vancomycin and will be transferred to [facility].    Geoffery Lyons, MD 02/28/12 [phone]

## 2012-02-28 NOTE — Progress Notes (Signed)
1220 Discharge instructions reviewed with patient  Verbalize and understand. Prescription given to patient. Skin right thigh boil with discoloration and small amouint of drainage dressing clean   and dry inatct

## 2012-03-01 LAB — WOUND CULTURE
Gram Stain: NONE SEEN
Special Requests: NORMAL

## 2012-05-15 ENCOUNTER — Emergency Department (HOSPITAL_BASED_OUTPATIENT_CLINIC_OR_DEPARTMENT_OTHER)
Admission: EM | Admit: 2012-05-15 | Discharge: 2012-05-15 | Disposition: A | Discharge status: Home / Self Care | Payer: Medicaid Other | Attending: Emergency Medicine | Admitting: Emergency Medicine

## 2012-05-15 ENCOUNTER — Encounter (HOSPITAL_BASED_OUTPATIENT_CLINIC_OR_DEPARTMENT_OTHER): Payer: Self-pay | Admitting: *Deleted

## 2012-05-15 DIAGNOSIS — F341 Dysthymic disorder: Secondary | ICD-10-CM | POA: Insufficient documentation

## 2012-05-15 DIAGNOSIS — F17200 Nicotine dependence, unspecified, uncomplicated: Secondary | ICD-10-CM | POA: Insufficient documentation

## 2012-05-15 DIAGNOSIS — M129 Arthropathy, unspecified: Secondary | ICD-10-CM | POA: Insufficient documentation

## 2012-05-15 DIAGNOSIS — M329 Systemic lupus erythematosus, unspecified: Secondary | ICD-10-CM | POA: Insufficient documentation

## 2012-05-15 DIAGNOSIS — Z862 Personal history of diseases of the blood and blood-forming organs and certain disorders involving the immune mechanism: Secondary | ICD-10-CM | POA: Insufficient documentation

## 2012-05-15 LAB — CBC WITH DIFFERENTIAL/PLATELET
Basophils Absolute: 0 10*3/uL (ref 0.0–0.1)
Basophils Relative: 0 % (ref 0–1)
Eosinophils Absolute: 0 10*3/uL (ref 0.0–0.7)
Eosinophils Relative: 1 % (ref 0–5)
Lymphs Abs: 0.9 10*3/uL (ref 0.7–4.0)
MCH: 29.6 pg (ref 26.0–34.0)
Neutrophils Relative %: 88 % — ABNORMAL HIGH (ref 43–77)
Platelets: 229 10*3/uL (ref 150–400)
RBC: 3.75 MIL/uL — ABNORMAL LOW (ref 3.87–5.11)
RDW: 13.7 % (ref 11.5–15.5)
WBC: 8.5 10*3/uL (ref 4.0–10.5)

## 2012-05-15 LAB — BASIC METABOLIC PANEL
Calcium: 9.2 mg/dL (ref 8.4–10.5)
GFR calc Af Amer: >90 mL/min (ref >90)
GFR calc non Af Amer: >90 mL/min (ref >90)
Glucose, Bld: 196 mg/dL — ABNORMAL HIGH (ref 70–99)
Potassium: 3.8 mEq/L (ref 3.5–5.1)
Sodium: 136 mEq/L (ref 135–145)

## 2012-05-15 LAB — URINALYSIS, ROUTINE W REFLEX MICROSCOPIC
Bilirubin Urine: NEGATIVE
Nitrite: NEGATIVE
Protein, ur: NEGATIVE mg/dL
Specific Gravity, Urine: 1.029 (ref 1.005–1.030)
Urobilinogen, UA: 0.2 mg/dL (ref 0.0–1.0)

## 2012-05-15 MED ORDER — HYDROMORPHONE HCL PF 1 MG/ML IJ SOLN
1.0000 mg | Freq: Once | INTRAMUSCULAR | Status: AC
  Administered 2012-05-15: 1 mg via INTRAVENOUS

## 2012-05-15 MED ORDER — PREDNISONE 20 MG PO TABS: 40.0000 mg | ORAL_TABLET | Freq: Every day | ORAL | Status: DC

## 2012-05-15 MED ORDER — MUPIROCIN CALCIUM 2 % EX CREA: TOPICAL_CREAM | Freq: Once | CUTANEOUS | Status: DC

## 2012-05-15 MED ORDER — HYDROMORPHONE HCL PF 1 MG/ML IJ SOLN
INTRAMUSCULAR | Status: AC
  Filled 2012-05-15: qty 1

## 2012-05-15 MED ORDER — ONDANSETRON HCL 4 MG/2ML IJ SOLN
4.0000 mg | Freq: Once | INTRAMUSCULAR | Status: AC
  Administered 2012-05-15: 4 mg via INTRAVENOUS
  Filled 2012-05-15: qty 2

## 2012-05-15 MED ORDER — METHYLPREDNISOLONE SODIUM SUCC 125 MG IJ SOLR
INTRAMUSCULAR | Status: AC
  Filled 2012-05-15: qty 2

## 2012-05-15 MED ORDER — OXYCODONE-ACETAMINOPHEN 5-325 MG PO TABS: 1.0000 | ORAL_TABLET | Freq: Four times a day (QID) | ORAL | Status: DC | PRN

## 2012-05-15 MED ORDER — HYDROMORPHONE HCL PF 1 MG/ML IJ SOLN
1.0000 mg | Freq: Once | INTRAMUSCULAR | Status: AC
  Administered 2012-05-15: 1 mg via INTRAVENOUS
  Filled 2012-05-15: qty 1

## 2012-05-15 MED ORDER — METHYLPREDNISOLONE SODIUM SUCC 125 MG IJ SOLR
125.0000 mg | Freq: Once | INTRAMUSCULAR | Status: AC
  Administered 2012-05-15: 125 mg via INTRAVENOUS

## 2012-05-15 MED ORDER — SODIUM CHLORIDE 0.9 % IV BOLUS (SEPSIS)
1000.0000 mL | Freq: Once | INTRAVENOUS | Status: AC
  Administered 2012-05-15: 1000 mL via INTRAVENOUS

## 2012-05-15 NOTE — ED Notes (Signed)
Pt states she has been feeling bad for few days with lupus flare up. Pt feels short of breath, aches all over, and swelling to both hands. Pt is out of percocet and pain is out of control.

## 2012-05-15 NOTE — ED Notes (Signed)
Pt given coke and cracker's  per request.  

## 2012-05-15 NOTE — ED Notes (Signed)
Pt has noted swelling to hands and joints diffuse.

## 2012-05-15 NOTE — ED Provider Notes (Signed)
History     CSN: [id number]  Arrival date & time 05/15/12  0127   First MD Initiated Contact with Patient 05/15/12 0140      Chief Complaint  Patient presents with  . Lupus    (Consider location/radiation/quality/duration/timing/severity/associated sxs/prior treatment) HPI This is a 31 year old female with a history of lupus and related chronic pain. She was seen by her new rheumatologist 3 days ago. She subsequently developed a cold, by which she means nasal congestion rhinorrhea and cough. She thinks this is triggered a lupus flareup. She is now having moderate to severe generalized joint pain, pain in skin nodules and inflammatory lesions of her skin. She has some shortness of breath earlier but thinks that may have been anxiety. She denies fever, nausea or vomiting. She is having swelling of her hands and feet. She has run out of her Percocet which was prescribed by her primary care physician last month. She took 40 mg of prednisone yesterday morning without any relief.  Past Medical History  Diagnosis Date  . Anemia   . Anxiety   . Depression   . Arthritis     hands, feet, elbows, knees  . Lupus     Past Surgical History  Procedure Date  . Septoplasty 04/2005  . Diagnostic laparoscopy 01/2010    w/LOA  . Wisdom tooth extraction   . Svd     X 2  . Vaginal hysterectomy 08/13/2011    Procedure: HYSTERECTOMY VAGINAL;  Surgeon: Zenaida Niece, MD;  Location: [facility];  Service: Gynecology;  Laterality: N/A;  . Cystoscopy 08/13/2011    Procedure: CYSTOSCOPY;  Surgeon: Zenaida Niece, MD;  Location: [facility];  Service: Gynecology;  Laterality: N/A;  . Abdominal hysterectomy     Family History  Problem Relation Age of Onset  . Rheum arthritis Other     History  Substance Use Topics  . Smoking status: Current Every Day Smoker -- 0.5 packs/day for 15 years    Types: Cigarettes  . Smokeless tobacco: Never Used  . Alcohol Use: No    OB History    Grav Para Term Preterm  Abortions TAB SAB Ect Mult Living                  Review of Systems  All other systems reviewed and are negative.    Allergies  Iron  Home Medications   Current Outpatient Rx  Name  Route  Sig  Dispense  Refill  . PREDNISONE 10 MG PO TABS   Oral   Take 10 mg by mouth daily.           BP 135/88  Pulse 94  Temp 98.5 F (36.9 C) (Oral)  Resp 20  Ht 5\' 7"  (1.702 m)  Wt 164 lb (74.39 kg)  BMI 25.69 kg/m2  SpO2 100%  LMP 07/29/2011  Physical Exam General: Well-developed, well-nourished female in no acute distress; appearance consistent with age of record HENT: normocephalic, atraumatic Eyes: pupils equal round and reactive to light; extraocular muscles intact Neck: supple Heart: regular rate and rhythm Lungs: clear to auscultation bilaterally Abdomen: soft; nondistended; nontender; no masses or hepatosplenomegaly; bowel sounds present Extremities: No deformity; decreased range of motion due to joint pain; edema of hands and feet Neurologic: Awake, alert and oriented; motor function intact in all extremities and symmetric; no facial droop Skin: Warm and dry; multiple firm tender nodules, notably near joints; several inflamed, cicatricial skin lesions of varying stages of healing Psychiatric: Mildly anxious  ED Course  Procedures (including critical care time)     MDM   Nursing notes and vitals signs, including pulse oximetry, reviewed.  Summary of this visit's results, reviewed by myself:  Labs:  Results for orders placed during the hospital encounter of 05/15/12  URINALYSIS, ROUTINE W REFLEX MICROSCOPIC      Component Value Range   Color, Urine YELLOW  YELLOW   APPearance CLEAR  CLEAR   Specific Gravity, Urine 1.029  1.005 - 1.030   pH 5.0  5.0 - 8.0   Glucose, UA NEGATIVE  NEGATIVE mg/dL   Hgb urine dipstick NEGATIVE  NEGATIVE   Bilirubin Urine NEGATIVE  NEGATIVE   Ketones, ur NEGATIVE  NEGATIVE mg/dL   Protein, ur NEGATIVE  NEGATIVE mg/dL    Urobilinogen, UA 0.2  0.0 - 1.0 mg/dL   Nitrite NEGATIVE  NEGATIVE   Leukocytes, UA NEGATIVE  NEGATIVE  CBC WITH DIFFERENTIAL      Component Value Range   WBC 8.5  4.0 - 10.5 K/uL   RBC 3.75 (*) 3.87 - 5.11 MIL/uL   Hemoglobin 11.1 (*) 12.0 - 15.0 g/dL   HCT 24.4 (*) 01.0 - 27.2 %   MCV 88.3  78.0 - 100.0 fL   MCH 29.6  26.0 - 34.0 pg   MCHC 33.5  30.0 - 36.0 g/dL   RDW 53.6  64.4 - 03.4 %   Platelets 229  150 - 400 K/uL   Neutrophils Relative 88 (*) 43 - 77 %   Neutro Abs 7.4  1.7 - 7.7 K/uL   Lymphocytes Relative 10 (*) 12 - 46 %   Lymphs Abs 0.9  0.7 - 4.0 K/uL   Monocytes Relative 2 (*) 3 - 12 %   Monocytes Absolute 0.1  0.1 - 1.0 K/uL   Eosinophils Relative 1  0 - 5 %   Eosinophils Absolute 0.0  0.0 - 0.7 K/uL   Basophils Relative 0  0 - 1 %   Basophils Absolute 0.0  0.0 - 0.1 K/uL  BASIC METABOLIC PANEL      Component Value Range   Sodium 136  135 - 145 mEq/L   Potassium 3.8  3.5 - 5.1 mEq/L   Chloride 101  96 - 112 mEq/L   CO2 21  19 - 32 mEq/L   Glucose, Bld 196 (*) 70 - 99 mg/dL   BUN 13  6 - 23 mg/dL   Creatinine, Ser 7.42  0.50 - 1.10 mg/dL   Calcium 9.2  8.4 - 59.5 mg/dL   GFR calc non Af Amer >90  >90 mL/min   GFR calc Af Amer >90  >90 mL/min   3:24 AM patient feeling better after IV fluids and pain medication. She was also given Solu-Medrol 125 mg IV. She has an appointment with her PCP in 4 days and her rheumatologist in 5 days. She requests refills for prednisone and Percocet pending followup.         Hanley Seamen, MD 05/15/12 [phone]

## 2012-06-28 ENCOUNTER — Encounter (HOSPITAL_BASED_OUTPATIENT_CLINIC_OR_DEPARTMENT_OTHER): Payer: Self-pay | Admitting: *Deleted

## 2012-06-28 ENCOUNTER — Emergency Department (HOSPITAL_BASED_OUTPATIENT_CLINIC_OR_DEPARTMENT_OTHER)
Admission: EM | Admit: 2012-06-28 | Discharge: 2012-06-28 | Disposition: A | Discharge status: Home / Self Care | Payer: Medicaid Other | Attending: Emergency Medicine | Admitting: Emergency Medicine

## 2012-06-28 DIAGNOSIS — M329 Systemic lupus erythematosus, unspecified: Secondary | ICD-10-CM | POA: Insufficient documentation

## 2012-06-28 DIAGNOSIS — Z862 Personal history of diseases of the blood and blood-forming organs and certain disorders involving the immune mechanism: Secondary | ICD-10-CM | POA: Insufficient documentation

## 2012-06-28 DIAGNOSIS — L97309 Non-pressure chronic ulcer of unspecified ankle with unspecified severity: Secondary | ICD-10-CM | POA: Insufficient documentation

## 2012-06-28 DIAGNOSIS — F329 Major depressive disorder, single episode, unspecified: Secondary | ICD-10-CM | POA: Insufficient documentation

## 2012-06-28 DIAGNOSIS — F17200 Nicotine dependence, unspecified, uncomplicated: Secondary | ICD-10-CM | POA: Insufficient documentation

## 2012-06-28 DIAGNOSIS — L97919 Non-pressure chronic ulcer of unspecified part of right lower leg with unspecified severity: Secondary | ICD-10-CM

## 2012-06-28 DIAGNOSIS — F3289 Other specified depressive episodes: Secondary | ICD-10-CM | POA: Insufficient documentation

## 2012-06-28 DIAGNOSIS — M129 Arthropathy, unspecified: Secondary | ICD-10-CM | POA: Insufficient documentation

## 2012-06-28 DIAGNOSIS — F411 Generalized anxiety disorder: Secondary | ICD-10-CM | POA: Insufficient documentation

## 2012-06-28 LAB — URINALYSIS, ROUTINE W REFLEX MICROSCOPIC
Leukocytes, UA: NEGATIVE
Nitrite: NEGATIVE
Protein, ur: NEGATIVE mg/dL
Specific Gravity, Urine: 1.024 (ref 1.005–1.030)
Urobilinogen, UA: 1 mg/dL (ref 0.0–1.0)

## 2012-06-28 MED ORDER — PREDNISONE 10 MG PO TABS: 20.0000 mg | ORAL_TABLET | Freq: Two times a day (BID) | ORAL | Status: DC

## 2012-06-28 MED ORDER — HYDROCODONE-ACETAMINOPHEN 5-325 MG PO TABS
12.0000 | ORAL_TABLET | Freq: Four times a day (QID) | ORAL | Status: DC | PRN
Start: 2012-06-28 — End: 2013-11-29

## 2012-06-28 MED ORDER — CEPHALEXIN 500 MG PO CAPS: 500.0000 mg | ORAL_CAPSULE | Freq: Four times a day (QID) | ORAL | Status: DC

## 2012-06-28 MED ORDER — OXYCODONE-ACETAMINOPHEN 5-325 MG PO TABS: 1.0000 | ORAL_TABLET | Freq: Four times a day (QID) | ORAL | Status: DC | PRN

## 2012-06-28 MED ORDER — SULFAMETHOXAZOLE-TRIMETHOPRIM 800-160 MG PO TABS: 1.0000 | ORAL_TABLET | Freq: Two times a day (BID) | ORAL | Status: DC

## 2012-06-28 NOTE — ED Notes (Signed)
Pt has hx of Lupus and this is a flare-up. She has been hurting for 4 days. Tried to call Dr. For Prednisone, but did not get call back. Also has sore on right lower leg that has been there for a month and a half.

## 2012-06-28 NOTE — ED Provider Notes (Signed)
History  This chart was scribed for Geoffery Lyons, MD by Shari Heritage, ED Scribe. The patient was seen in room MH07/MH07. Patient's care was started at 1755.  CSN: [id number]  Arrival date & time 06/28/12  1658   First MD Initiated Contact with Patient 06/28/12 1755      Chief Complaint  Patient presents with  . Generalized Body Aches    The history is provided by the patient. No language interpreter was used.    HPI Comments: [name] is a 31 y.o. female with history of lupus who presents to the Emergency Department complaining of moderate, constant generalized body aches onset 4 days ago. Patient also reports a reddened sore to the right lateral ankle that has been present for 1.5 months. Patient says that she experienced some mild shortness of breath last night that is now resolved. Patient says that she normally is treated with prednisone for flare ups, but she hasn't gotten a call back from her PCP's office. Patient states that the amount of prednisone  She has been on prednisone for the past 3 years. Patient says that she finished a course of antibiotics to treat the sore on her leg one month ago. Patient denies any other symptoms at this time. Patient's other medical history includes anemia, anxiety and depression.  PCP - Cyndia Bent   Past Medical History  Diagnosis Date  . Anemia   . Anxiety   . Depression   . Arthritis     hands, feet, elbows, knees  . Lupus     Past Surgical History  Procedure Date  . Septoplasty 04/2005  . Diagnostic laparoscopy 01/2010    w/LOA  . Wisdom tooth extraction   . Svd     X 2  . Vaginal hysterectomy 08/13/2011    Procedure: HYSTERECTOMY VAGINAL;  Surgeon: Zenaida Niece, MD;  Location: [facility];  Service: Gynecology;  Laterality: N/A;  . Cystoscopy 08/13/2011    Procedure: CYSTOSCOPY;  Surgeon: Zenaida Niece, MD;  Location: [facility];  Service: Gynecology;  Laterality: N/A;  . Abdominal hysterectomy     Family History  Problem  Relation Age of Onset  . Rheum arthritis Other     History  Substance Use Topics  . Smoking status: Current Every Day Smoker -- 0.5 packs/day for 15 years    Types: Cigarettes  . Smokeless tobacco: Never Used  . Alcohol Use: No    OB History    Grav Para Term Preterm Abortions TAB SAB Ect Mult Living                  Review of Systems  Musculoskeletal: Positive for myalgias.  Skin: Positive for wound.  All other systems reviewed and are negative.    Allergies  Iron  Home Medications   Current Outpatient Rx  Name  Route  Sig  Dispense  Refill  . OXYCODONE-ACETAMINOPHEN 5-325 MG PO TABS   Oral   Take 1-2 tablets by mouth every 6 (six) hours as needed for pain.   30 tablet   0   . PREDNISONE 10 MG PO TABS   Oral   Take 10 mg by mouth daily.         Marland Kitchen PREDNISONE 20 MG PO TABS   Oral   Take 2 tablets (40 mg total) by mouth daily.   10 tablet   0     Triage Vitals: BP 102/61  Pulse 87  Temp 98.1 F (36.7 C) (Oral)  Resp  18  Ht 5\' 8"  (1.727 m)  Wt 164 lb (74.39 kg)  BMI 24.94 kg/m2  SpO2 100%  LMP 07/29/2011  Physical Exam  Constitutional: She is oriented to person, place, and time. She appears well-developed and well-nourished.       Appears somewhat anxious.   HENT:  Head: Normocephalic and atraumatic.  Mouth/Throat: Mucous membranes are normal. Mucous membranes are not dry.  Neck: Neck supple.  Cardiovascular: Normal rate and regular rhythm.   No murmur heard. Pulmonary/Chest: Effort normal and breath sounds normal. No respiratory distress. She has no wheezes. She has no rales.  Abdominal: Soft. Bowel sounds are normal. She exhibits no distension. There is no tenderness. There is no rebound and no guarding.  Musculoskeletal: She exhibits no edema and no tenderness.  Neurological: She is alert and oriented to person, place, and time.  Skin: Skin is warm. Lesion noted.       On lateral aspect of right ankle, there is a 3 cm round lesion with  surrounding erythema with purulent material.     ED Course  Procedures (including critical care time) DIAGNOSTIC STUDIES: Oxygen Saturation is 100% on room air, normal by my interpretation.    COORDINATION OF CARE: 6:06 PM- Patient informed of current plan for treatment and evaluation and agrees with plan at this time.  Results for orders placed during the hospital encounter of 06/28/12  URINALYSIS, ROUTINE W REFLEX MICROSCOPIC      Component Value Range   Color, Urine YELLOW  YELLOW   APPearance CLOUDY (*) CLEAR   Specific Gravity, Urine 1.024  1.005 - 1.030   pH 7.0  5.0 - 8.0   Glucose, UA NEGATIVE  NEGATIVE mg/dL   Hgb urine dipstick NEGATIVE  NEGATIVE   Bilirubin Urine NEGATIVE  NEGATIVE   Ketones, ur NEGATIVE  NEGATIVE mg/dL   Protein, ur NEGATIVE  NEGATIVE mg/dL   Urobilinogen, UA 1.0  0.0 - 1.0 mg/dL   Nitrite NEGATIVE  NEGATIVE   Leukocytes, UA NEGATIVE  NEGATIVE    No results found.   No diagnosis found.    MDM  Patient with lupus flare and unable to get to pcp.  She says they normally give her prednisone.  Also with a sore to the right leg that appears infected.  Will treat with keflex and bactrim, give prednisone for the lupus flare and she wants something for pain for her body aches.  To return prn      I personally performed the services described in this documentation, which was scribed in my presence. The recorded information has been reviewed and is accurate.      Geoffery Lyons, MD 06/28/12 2124

## 2012-09-03 ENCOUNTER — Other Ambulatory Visit: Payer: Self-pay

## 2012-09-16 ENCOUNTER — Ambulatory Visit: Payer: Medicaid Other | Admitting: Family Medicine

## 2012-12-17 ENCOUNTER — Emergency Department (HOSPITAL_BASED_OUTPATIENT_CLINIC_OR_DEPARTMENT_OTHER): Payer: Medicaid Other

## 2012-12-17 ENCOUNTER — Encounter (HOSPITAL_BASED_OUTPATIENT_CLINIC_OR_DEPARTMENT_OTHER): Payer: Self-pay | Admitting: *Deleted

## 2012-12-17 ENCOUNTER — Emergency Department (HOSPITAL_BASED_OUTPATIENT_CLINIC_OR_DEPARTMENT_OTHER)
Admission: EM | Admit: 2012-12-17 | Discharge: 2012-12-17 | Disposition: A | Discharge status: Home / Self Care | Payer: Medicaid Other | Attending: Emergency Medicine | Admitting: Emergency Medicine

## 2012-12-17 DIAGNOSIS — Z8659 Personal history of other mental and behavioral disorders: Secondary | ICD-10-CM | POA: Insufficient documentation

## 2012-12-17 DIAGNOSIS — R748 Abnormal levels of other serum enzymes: Secondary | ICD-10-CM | POA: Insufficient documentation

## 2012-12-17 DIAGNOSIS — Z792 Long term (current) use of antibiotics: Secondary | ICD-10-CM | POA: Insufficient documentation

## 2012-12-17 DIAGNOSIS — R5381 Other malaise: Secondary | ICD-10-CM | POA: Insufficient documentation

## 2012-12-17 DIAGNOSIS — Z79899 Other long term (current) drug therapy: Secondary | ICD-10-CM | POA: Insufficient documentation

## 2012-12-17 DIAGNOSIS — Z862 Personal history of diseases of the blood and blood-forming organs and certain disorders involving the immune mechanism: Secondary | ICD-10-CM | POA: Insufficient documentation

## 2012-12-17 DIAGNOSIS — R21 Rash and other nonspecific skin eruption: Secondary | ICD-10-CM

## 2012-12-17 DIAGNOSIS — IMO0002 Reserved for concepts with insufficient information to code with codable children: Secondary | ICD-10-CM | POA: Insufficient documentation

## 2012-12-17 DIAGNOSIS — F17200 Nicotine dependence, unspecified, uncomplicated: Secondary | ICD-10-CM | POA: Insufficient documentation

## 2012-12-17 DIAGNOSIS — R7989 Other specified abnormal findings of blood chemistry: Secondary | ICD-10-CM

## 2012-12-17 DIAGNOSIS — Z8739 Personal history of other diseases of the musculoskeletal system and connective tissue: Secondary | ICD-10-CM | POA: Insufficient documentation

## 2012-12-17 DIAGNOSIS — R531 Weakness: Secondary | ICD-10-CM

## 2012-12-17 LAB — COMPREHENSIVE METABOLIC PANEL
ALT: 6 U/L (ref 0–35)
AST: 14 U/L (ref 0–37)
Albumin: 3.3 g/dL — ABNORMAL LOW (ref 3.5–5.2)
CO2: 24 mEq/L (ref 19–32)
Calcium: 9.6 mg/dL (ref 8.4–10.5)
Chloride: 103 mEq/L (ref 96–112)
Creatinine, Ser: 0.8 mg/dL (ref 0.50–1.10)
Sodium: 140 mEq/L (ref 135–145)
Total Bilirubin: 0.2 mg/dL — ABNORMAL LOW (ref 0.3–1.2)

## 2012-12-17 LAB — CBC WITH DIFFERENTIAL/PLATELET
Basophils Absolute: 0 10*3/uL (ref 0.0–0.1)
Basophils Relative: 0 % (ref 0–1)
HCT: 33.1 % — ABNORMAL LOW (ref 36.0–46.0)
Lymphocytes Relative: 24 % (ref 12–46)
MCHC: 32.9 g/dL (ref 30.0–36.0)
Monocytes Absolute: 0.5 10*3/uL (ref 0.1–1.0)
Neutro Abs: 4.9 10*3/uL (ref 1.7–7.7)
Platelets: 265 10*3/uL (ref 150–400)
RDW: 13.1 % (ref 11.5–15.5)
WBC: 7.5 10*3/uL (ref 4.0–10.5)

## 2012-12-17 LAB — D-DIMER, QUANTITATIVE: D-Dimer, Quant: 6.1 ug/mL-FEU — ABNORMAL HIGH (ref 0.00–0.48)

## 2012-12-17 LAB — URINALYSIS, ROUTINE W REFLEX MICROSCOPIC
Bilirubin Urine: NEGATIVE
Glucose, UA: NEGATIVE mg/dL
Hgb urine dipstick: NEGATIVE
Specific Gravity, Urine: 1.028 (ref 1.005–1.030)

## 2012-12-17 MED ORDER — IOHEXOL 350 MG/ML SOLN
80.0000 mL | Freq: Once | INTRAVENOUS | Status: AC | PRN
  Administered 2012-12-17: 80 mL via INTRAVENOUS

## 2012-12-17 MED ORDER — POTASSIUM CHLORIDE CRYS ER 20 MEQ PO TBCR
40.0000 meq | EXTENDED_RELEASE_TABLET | Freq: Once | ORAL | Status: AC
  Administered 2012-12-17: 40 meq via ORAL
  Filled 2012-12-17: qty 2

## 2012-12-17 MED ORDER — SODIUM CHLORIDE 0.9 % IV BOLUS (SEPSIS)
1000.0000 mL | Freq: Once | INTRAVENOUS | Status: AC
  Administered 2012-12-17: 1000 mL via INTRAVENOUS

## 2012-12-17 MED ORDER — OXYCODONE-ACETAMINOPHEN 5-325 MG PO TABS
ORAL_TABLET | ORAL | Status: AC
  Filled 2012-12-17: qty 1

## 2012-12-17 MED ORDER — OXYCODONE-ACETAMINOPHEN 5-325 MG PO TABS
1.0000 | ORAL_TABLET | ORAL | Status: DC | PRN
Start: 2012-12-17 — End: 2013-11-29

## 2012-12-17 MED ORDER — DOXYCYCLINE HYCLATE 100 MG PO CAPS: 100.0000 mg | ORAL_CAPSULE | Freq: Two times a day (BID) | ORAL | Status: DC

## 2012-12-17 MED ORDER — OXYCODONE-ACETAMINOPHEN 5-325 MG PO TABS
1.0000 | ORAL_TABLET | Freq: Once | ORAL | Status: AC
  Administered 2012-12-17: 1 via ORAL
  Filled 2012-12-17: qty 1

## 2012-12-17 NOTE — ED Provider Notes (Signed)
History     CSN: [id number]  Arrival date & time 12/17/12  7829   First MD Initiated Contact with Patient 12/17/12 1817      Chief Complaint  Patient presents with  . Shortness of Breath    (Consider location/radiation/quality/duration/timing/severity/associated sxs/prior treatment) HPI Patient presents today complaining of dyspnea for 4 days. She states she has had some cough but is unclear what the productive mucus has looked like. She has had some chills but denies any fever. She had a syncopal episode last night. She describes it as standing and feeling lightheaded. She she was with a family member. She states that she fell to the ground and injured her right side of her body. She feels that she was only out for seconds. 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 She in the past has been on it and has not been on this recently due to financial constraints. This has been for several months. He has developed a rash on her bilateral forearms. She has noted that she was bitten by a tick several weeks ago. She has felt generally weak but has been taking by mouth without difficulty. She has had a hysterectomy in the past. She was seen for an infected thrombophlebitis of the right lower extremity but at that time did not have a DVT in August 2013. Past Medical History  Diagnosis Date  . Anemia   . Anxiety   . Depression   . Arthritis     hands, feet, elbows, knees  . Lupus     Past Surgical History  Procedure Laterality Date  . Septoplasty  04/2005  . Diagnostic laparoscopy  01/2010    w/LOA  . Wisdom tooth extraction    . Svd      X 2  . Vaginal hysterectomy  08/13/2011    Procedure: HYSTERECTOMY VAGINAL;  Surgeon: Zenaida Niece, MD;  Location: [facility];  Service: Gynecology;  Laterality: N/A;  . Cystoscopy  08/13/2011    Procedure: CYSTOSCOPY;  Surgeon: Zenaida Niece, MD;  Location: [facility];  Service: Gynecology;  Laterality: N/A;   . Abdominal hysterectomy      Family History  Problem Relation Age of Onset  . Rheum arthritis Other     History  Substance Use Topics  . Smoking status: Current Every Day Smoker -- 0.50 packs/day for 15 years    Types: Cigarettes  . Smokeless tobacco: Never Used  . Alcohol Use: No    OB History   Grav Para Term Preterm Abortions TAB SAB Ect Mult Living                  Review of Systems  All other systems reviewed and are negative.    Allergies  Iron  Home Medications   Current Outpatient Rx  Name  Route  Sig  Dispense  Refill  . cephALEXin (KEFLEX) 500 MG capsule   Oral   Take 1 capsule (500 mg total) by mouth 4 (four) times daily.   40 capsule   0   . HYDROcodone-acetaminophen (NORCO/VICODIN) 5-325 MG per tablet   Oral   Take 12 tablets by mouth every 6 (six) hours as needed for pain.   15 tablet   0   . oxyCODONE-acetaminophen (PERCOCET/ROXICET) 5-325 MG per tablet   Oral   Take 1-2 tablets by mouth every 6 (six) hours as needed for pain.   30 tablet   0   . oxyCODONE-acetaminophen (  PERCOCET/ROXICET) 5-325 MG per tablet   Oral   Take 1-2 tablets by mouth every 6 (six) hours as needed for pain.   15 tablet   0   . predniSONE (DELTASONE) 10 MG tablet   Oral   Take 10 mg by mouth daily.         . predniSONE (DELTASONE) 10 MG tablet   Oral   Take 2 tablets (20 mg total) by mouth 2 (two) times daily.   20 tablet   0   . predniSONE (DELTASONE) 20 MG tablet   Oral   Take 2 tablets (40 mg total) by mouth daily.   10 tablet   0   . sulfamethoxazole-trimethoprim (BACTRIM DS,SEPTRA DS) 800-160 MG per tablet   Oral   Take 1 tablet by mouth 2 (two) times daily.   20 tablet   0     BP 139/92  Pulse 102  Temp(Src) 98.8 F (37.1 C) (Oral)  Resp 24  SpO2 100%  LMP 07/29/2011  Physical Exam  Nursing note and vitals reviewed. Constitutional: She is oriented to person, place, and time. She appears well-developed and well-nourished.   HENT:  Head: Normocephalic and atraumatic.  Right Ear: External ear normal.  Left Ear: External ear normal.  Nose: Nose normal.  Mouth/Throat: Oropharynx is clear and moist.  Eyes: Conjunctivae and EOM are normal. Pupils are equal, round, and reactive to light.  Neck: Normal range of motion. Neck supple.  Cardiovascular: Normal rate, regular rhythm, normal heart sounds and intact distal pulses.   Pulmonary/Chest: Effort normal and breath sounds normal.  Abdominal: Soft. Bowel sounds are normal.  Musculoskeletal: Normal range of motion.  Neurological: She is alert and oriented to person, place, and time. She has normal reflexes.  Skin: Skin is warm and dry.  Nodular rash bilateral forearms with erythema of nodules no central clearing is noted  Psychiatric: She has a normal mood and affect. Her behavior is normal. Thought content normal.    ED Course  Procedures (including critical care time)  Labs Reviewed  D-DIMER, QUANTITATIVE - Abnormal; Notable for the following:    D-Dimer, Quant 6.10 (*)    All other components within normal limits  CBC WITH DIFFERENTIAL - Abnormal; Notable for the following:    RBC 3.84 (*)    Hemoglobin 10.9 (*)    HCT 33.1 (*)    All other components within normal limits  COMPREHENSIVE METABOLIC PANEL - Abnormal; Notable for the following:    Potassium 3.2 (*)    Glucose, Bld 113 (*)    Albumin 3.3 (*)    Total Bilirubin 0.2 (*)    All other components within normal limits  URINALYSIS, ROUTINE W REFLEX MICROSCOPIC   Dg Chest 2 View  12/17/2012   *RADIOLOGY REPORT*  Clinical Data: Short of breath  CHEST - 2 VIEW  Comparison: 02/23/2012  Findings: Lung volume is normal.  Negative for heart failure. Negative for pneumonia or effusion.  Lungs remain clear  IMPRESSION: No acute abnormality.   Original Report Authenticated By: Janeece Riggers, M.D.   Ct Angio Chest Pe W/cm &/or Wo Cm  12/17/2012   *RADIOLOGY REPORT*  Clinical Data: Shortness of breath,  history of lupus and elevated D- dimer.  CT ANGIOGRAPHY CHEST  Technique:  Multidetector CT imaging of the chest using the standard protocol during bolus administration of intravenous contrast. Multiplanar reconstructed images including MIPs were obtained and reviewed to evaluate the vascular anatomy.  Contrast: 80mL OMNIPAQUE IOHEXOL 350 MG/ML SOLN  Comparison: Prior CTA on 02/23/2012  Findings: The pulmonary arteries are well opacified.  There is no evidence of acute pulmonary embolism.  The thoracic aorta is of normal caliber.  Previously identified patchy areas of ground-glass opacity in the anterior upper lobes have resolved.  There is no evidence of pulmonary infiltrate, edema, pneumothorax or pulmonary nodule.  The heart size is normal.  No pleural or pericardial fluid is seen. No enlarged lymph nodes are identified in the chest.  The bony thorax is unremarkable.  IMPRESSION: Normal CTA of the chest demonstrating no evidence of pulmonary embolism or other acute findings.  Previously identified ground- glass opacities of the upper lobes bilaterally have resolved.   Original Report Authenticated By: Irish Lack, M.D.     No diagnosis found.    Date: 12/17/2012  Rate: 76  Rhythm: normal sinus rhythm  QRS Axis: normal  Intervals: normal  ST/T Wave abnormalities: normal  Conduction Disutrbances: none  Narrative Interpretation: unremarkable     MDM     Patient with elevated d-dimer and appears to be chronic in nature. CT of the chest is negative for pulmonary embolism. Patient has a history of tick bite and rash with some infectious symptoms I will be placed on doxycycline.  Otherwise labs are normal and patient advised have close followup to reestablish care with her rheumatologist.  Hilario Quarry, MD 12/17/12 2055

## 2012-12-17 NOTE — ED Notes (Addendum)
Patient states that she has been SOB for about a week, states that she has "body swelling", joint pain, states that last night she "passed out", says she went to sleep and did not seek medical help. Coughing for a few weeks, States that she has been having joint pain and swelling for the past 5 years, has been told that she either has lupus or rhuemitoid arthritis.

## 2013-11-29 ENCOUNTER — Ambulatory Visit: Payer: Self-pay

## 2013-11-29 ENCOUNTER — Ambulatory Visit: Payer: Self-pay | Admitting: Physician Assistant

## 2013-11-29 VITALS — BP 116/84 | HR 80 | Temp 98.1°F | Resp 16 | Ht 68.0 in | Wt 155.0 lb

## 2013-11-29 DIAGNOSIS — M2550 Pain in unspecified joint: Secondary | ICD-10-CM

## 2013-11-29 MED ORDER — OXYCODONE-ACETAMINOPHEN 5-325 MG PO TABS: 1.0000 | ORAL_TABLET | Freq: Three times a day (TID) | ORAL | Status: DC | PRN

## 2013-11-29 NOTE — Progress Notes (Signed)
Subjective:    Patient ID: [name], female    DOB: Jun 18, 1981, 33 y.o.   MRN: [id number]  HPI Pt presents to clinic with bilateral hip pain for weeks but most recently over the last 2 weeks a rash an indentation on her left buttocks that seems to be tender, she also seems swollen at the base of her spine.  She sees Dr Herma Carson - at [facility] - who has not figured out whether she has Lupus or a type of RA.  Her last visit was 1 month ago and she has an appt in July.  She has not had any injury to the hips recently.  She does not have daily pain and typically needs no pain medications but she does have flairs of pain that typically cause her to go to the ED for pain control.  She has not had any recent tick bites, infections or rashes different than what she normally has.  She has been on prednisone on/off for at least 6 years. Last dose pack was about 6 months ago she thinks.  Review of Systems  Constitutional: Negative for fever and chills.  Musculoskeletal: Positive for gait problem (2nd to pain) and joint swelling.       Objective:   Physical Exam  Vitals reviewed. Constitutional: She is oriented to person, place, and time. She appears well-developed and well-nourished.  HENT:  Head: Normocephalic and atraumatic.  Right Ear: External ear normal.  Left Ear: External ear normal.  Eyes: Conjunctivae are normal.  Pulmonary/Chest: Effort normal.  Musculoskeletal:       Left hip: She exhibits normal range of motion and normal strength.       Back:  General TTP over hips - most of her TTP is around the SI joint - she has swelling over the lumbarsacral area.  There is a 1x2 in indention (see media under photo) over her L buttocks just lateral to her SI joints.  The skin is slightly discolored and even tender with gentle touch.  Neurological: She is alert and oriented to person, place, and time. She has normal strength. No sensory deficit.  Reflex Scores:      Patellar  reflexes are 2+ on the right side and 2+ on the left side.      Achilles reflexes are 2+ on the right side and 2+ on the left side. Skin: Skin is warm and dry.  Annular macular dry rash in multiple areas of her body in differing sizes.  Psychiatric: She has a normal mood and affect. Her behavior is normal. Judgment and thought content normal.    See media for photos.    Assessment & Plan:  Joint pain - Plan: oxyCODONE-acetaminophen (PERCOCET/ROXICET) 5-325 MG per tablet, CANCELED: DG Hip Complete Left, CANCELED: DG Hip Complete Right, CANCELED: POCT CBC, CANCELED: POCT SEDIMENTATION RATE, CANCELED: Sedimentation Rate  I d/w this case with Dr Merla Riches.  I wanted to do an xray to R/o AVN though her pain does not seem to be in the right distribution but she does have prednisone use history so she is at increased risk.  I also wanted to do a sed rate to look for acute inflammation - pt refused both because I want her to her rheumatologist this week and pt stated that Dr Herma Carson will want to repeat everything and she does not have insurance and cannot afford these tests.  She will contact the rheumatologist and get an appt for this week and if she  has problems she will contact me for help getting an appt.  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  I did give her pain medications to last for the week but did not start prednisone because of no labs and the fact that I want the rheumatologist to eval her before steroids are used.  Benny LennertSarah Reshawn Ostlund PA-C  [facility] 12/01/2013 6:48 PM

## 2013-11-29 NOTE — Patient Instructions (Signed)
Call rheumatology - you need to be seen this week - if they cannot get you in please let me know so I can call on your behalf  Avascular Necrosis Sclera Derma Discoid Lupus

## 2013-12-03 ENCOUNTER — Telehealth: Payer: Self-pay

## 2013-12-03 DIAGNOSIS — M2550 Pain in unspecified joint: Secondary | ICD-10-CM

## 2013-12-03 NOTE — Telephone Encounter (Signed)
Pt saw Benny Lennert and was prescribed oxyCODONE-acetaminophen (PERCOCET/ROXICET) 5-325 MG per tablet, and is waiting on a referral to go through, shw would like to know if she can get a refill to get her through until she can be seen for her [phone]

## 2013-12-04 MED ORDER — OXYCODONE-ACETAMINOPHEN 5-325 MG PO TABS: 1.0000 | ORAL_TABLET | Freq: Three times a day (TID) | ORAL | Status: DC | PRN

## 2013-12-04 NOTE — Telephone Encounter (Signed)
Rx printed. Meds ordered this encounter  Medications  . oxyCODONE-acetaminophen (PERCOCET/ROXICET) 5-325 MG per tablet    Sig: Take 1 tablet by mouth every 8 (eight) hours as needed.    Dispense:  20 tablet    Refill:  0    Order Specific Question:  Supervising Provider    Answer:  DOOLITTLE, ROBERT P [[id number]]

## 2013-12-04 NOTE — Telephone Encounter (Signed)
The patient called regarding update on her Percocet rx.  The patient states she is in excruciating pain and would like to get a rx refill until she can get in with the specialist.  Please call patient at [phone]

## 2013-12-05 NOTE — Telephone Encounter (Signed)
Pt notified that rx is ready for pick up

## 2013-12-22 ENCOUNTER — Telehealth: Payer: Self-pay

## 2013-12-22 DIAGNOSIS — M2550 Pain in unspecified joint: Secondary | ICD-10-CM

## 2013-12-22 NOTE — Telephone Encounter (Signed)
Patient called and states she plans to make the appt to the dermatologist on Friday. States she does not have the money to see a doctor until then and she is in pain. Wants to know if Benny LennertSarah Weber can please give her something for pain.

## 2013-12-23 NOTE — Telephone Encounter (Signed)
Patient called once more regarding her pain medication. Please return call and advise. Thank you

## 2013-12-23 NOTE — Telephone Encounter (Signed)
Pt was supposed to see her rheumatologist already.  I need to know when the appt is - continuing to Rx pain medications in not appropriate.  I am happy to call the rheumatologist and get her in.

## 2013-12-24 NOTE — Telephone Encounter (Signed)
Patient called back and states that she actually does have an appointment with a rheumatologist  on July 28th @ 3pm.

## 2013-12-24 NOTE — Telephone Encounter (Signed)
Pt states that she will not be seeing a rheumatologist. She was referred to Pain Management but her appt is not until August. She wants to get into see a dermatologist about her lupus. She is going to call and make that appt. Can she be prescribed something until this appt is made?

## 2013-12-26 MED ORDER — OXYCODONE-ACETAMINOPHEN 5-325 MG PO TABS: 1.0000 | ORAL_TABLET | Freq: Three times a day (TID) | ORAL | Status: DC | PRN

## 2013-12-26 NOTE — Telephone Encounter (Signed)
This is the last Rx I can give her.

## 2013-12-27 NOTE — Telephone Encounter (Signed)
Pt notified and rx up front for p/u 

## 2014-03-29 ENCOUNTER — Encounter (HOSPITAL_COMMUNITY): Payer: Self-pay | Admitting: Emergency Medicine

## 2014-03-29 ENCOUNTER — Emergency Department (HOSPITAL_COMMUNITY)
Admission: EM | Admit: 2014-03-29 | Discharge: 2014-03-29 | Disposition: A | Discharge status: Home / Self Care | Payer: Medicaid Other | Attending: Emergency Medicine | Admitting: Emergency Medicine

## 2014-03-29 DIAGNOSIS — R197 Diarrhea, unspecified: Secondary | ICD-10-CM | POA: Diagnosis not present

## 2014-03-29 DIAGNOSIS — Z862 Personal history of diseases of the blood and blood-forming organs and certain disorders involving the immune mechanism: Secondary | ICD-10-CM | POA: Insufficient documentation

## 2014-03-29 DIAGNOSIS — F1110 Opioid abuse, uncomplicated: Secondary | ICD-10-CM | POA: Insufficient documentation

## 2014-03-29 DIAGNOSIS — F17200 Nicotine dependence, unspecified, uncomplicated: Secondary | ICD-10-CM | POA: Insufficient documentation

## 2014-03-29 DIAGNOSIS — Z79899 Other long term (current) drug therapy: Secondary | ICD-10-CM | POA: Diagnosis not present

## 2014-03-29 DIAGNOSIS — F411 Generalized anxiety disorder: Secondary | ICD-10-CM | POA: Insufficient documentation

## 2014-03-29 DIAGNOSIS — F1910 Other psychoactive substance abuse, uncomplicated: Secondary | ICD-10-CM

## 2014-03-29 DIAGNOSIS — F1410 Cocaine abuse, uncomplicated: Secondary | ICD-10-CM | POA: Diagnosis not present

## 2014-03-29 DIAGNOSIS — Z8739 Personal history of other diseases of the musculoskeletal system and connective tissue: Secondary | ICD-10-CM | POA: Insufficient documentation

## 2014-03-29 NOTE — Discharge Instructions (Signed)
Opioid Withdrawal °Opioids are a group of narcotic drugs. They include the street drug heroin. They also include pain medicines, such as morphine, hydrocodone, oxycodone, and fentanyl. Opioid withdrawal is a group of characteristic physical and mental signs and symptoms. It typically occurs if you have been using opioids daily for several weeks or longer and stop using or rapidly decrease use. Opioid withdrawal can also occur if you have used opioids daily for a long time and are given a medicine to block the effect.  °SIGNS AND SYMPTOMS °Opioid withdrawal includes three or more of the following symptoms:  °· Depressed, anxious, or irritable mood. °· Nausea or vomiting. °· Muscle aches or spasms.   °· Watery eyes.    °· Runny nose. °· Dilated pupils, sweating, or hairs standing on end. °· Diarrhea or intestinal cramping. °· Yawning.   °· Fever. °· Increased blood pressure. °· Fast pulse. °· Restlessness or trouble sleeping. °These signs and symptoms occur within several hours of stopping or reducing short-acting opioids, such as heroin. They can occur within 3 days of stopping or reducing long-acting opioids, such as methadone. Withdrawal begins within minutes of receiving a drug that blocks the effects of opioids, such as naltrexone or naloxone. °DIAGNOSIS  °Opioid use disorder is diagnosed by your health care provider. You will be asked about your symptoms, drug and alcohol use, medical history, and use of medicines. A physical exam may be done. Lab tests may be ordered. Your health care provider may have you see a mental health professional.  °TREATMENT  °The treatment for opioid withdrawal is usually provided by medical doctors with special training in substance use disorders (addiction specialists). The following medicines may be included in treatment: °· Opioids given in place of the abused opioid. They turn on opioid receptors in the brain and lessen or prevent withdrawal symptoms. They are gradually  decreased (opioid substitution and taper). °· Non-opioids that can lessen certain opioid withdrawal symptoms. They may be used alone or with opioid substitution and taper. °Successful long-term recovery usually requires medicine, counseling, and group support. °HOME CARE INSTRUCTIONS  °· Take medicines only as directed by your health care provider. °· Check with your health care provider before starting new medicines. °· Keep all follow-up visits as directed by your health care provider. °SEEK MEDICAL CARE IF: °· You are not able to take your medicines as directed. °· Your symptoms get worse. °· You relapse. °SEEK IMMEDIATE MEDICAL CARE IF: °· You have serious thoughts about hurting yourself or others. °· You have a seizure. °· You lose consciousness. °Document Released: 06/27/2003 Document Revised: 11/08/2013 Document Reviewed: 07/07/2013 °ExitCare® Patient Information ©2015 [employer]. This information is not intended to replace advice given to you by your health care provider. Make sure you discuss any questions you have with your health care provider. ° °Emergency Department Resource Guide °1) Find a Doctor and Pay Out of Pocket °Although you won't have to find out who is covered by your insurance plan, it is a good idea to ask around and get recommendations. You will then need to call the office and see if the doctor you have chosen will accept you as a new patient and what types of options they offer for patients who are self-pay. Some doctors offer discounts or will set up payment plans for their patients who do not have insurance, but you will need to ask so you aren't surprised when you get to your appointment. ° °2) Contact Your Local Health Department °Not all health departments have   doctors that can see patients for sick visits, but many do, so it is worth a call to see if yours does. If you don't know where your local health department is, you can check in your phone book. The CDC also has a tool to  help you locate your state's health department, and many state websites also have listings of all of their local health departments. ° °3) Find a Walk-in Clinic °If your illness is not likely to be very severe or complicated, you may want to try a walk in clinic. These are popping up all over the country in pharmacies, drugstores, and shopping centers. They're usually staffed by nurse practitioners or physician assistants that have been trained to treat common illnesses and complaints. They're usually fairly quick and inexpensive. However, if you have serious medical issues or chronic medical problems, these are probably not your best option. ° °No Primary Care Doctor: °- Call [facility] at  832-8000 - they can help you locate a primary care doctor that  accepts your insurance, provides certain services, etc. °- Physician Referral Service- 1-800-533-3463 ° °Chronic Pain Problems: °Organization         Address  Phone   Notes  °[facility]  (336) 297-2271 Patients need to be referred by their primary care doctor.  ° °Medication Assistance: °Organization         Address  Phone   Notes  °[place] [facility] [address] °[place], [place] [address] (336) 641-8030 --Must be a resident of [place] °-- Must have NO insurance coverage whatsoever (no Medicaid/ Medicare, etc.) °-- The pt. MUST have a primary care doctor that directs their care regularly and follows them in the community °  °[facility]  (866) 331-1348   °[employer]  (888) 892-1162   ° °Agencies that provide inexpensive medical care: °Organization         Address  Phone   Notes  °[facility]  (336) 832-8035   °[facility]    (336) 832-7272   °[facility] [address] °[place], [place] [address] (336) 832-4777   °[facility] [address], °[place] (336) 271-4999   °[facility]    (336) 373-0678   °[facility]    (336) 272-1050   °[facility] ° [address], [place] Phone:  (336) 832-4444, Fax:  (336) 832-4440 Hours of Operation:  9 am - 6 pm, M-F.  Also accepts Medicaid/Medicare and self-pay.  °[facility] ° [address], [place] Phone: (336) 832-3150, Fax: (336) 832-3151. Hours of Operation:  8:30 am - 5:30 pm, M-F.  Also accepts Medicaid and self-pay.  °[facility] [place] [address], [place] Phone: (336) 878-6027   °[facility] [address], [place],  (336)723-1848, Ext. 123 Mondays & Thursdays: 7-9 AM.  First 15 patients are seen on a first come, first serve basis. °  ° °Medicaid-accepting [place] Providers: ° °Organization         Address  Phone   Notes  °[facility] [address], [place] (336) 641-2100 Also accepts self-pay patients.  °[facility] [address], [place] ° (336) 856-9996   °[facility] [address], [place] (336) 288-8857   °[facility] [address]   [place] [address], [place] (336) 299-7000   °Veita Bland [address], [place]  ° (336) 373-1557 Only accepts [place] Access Medicaid patients after they have their name applied to their card.  ° °Self-Pay (no insurance) in [place]: ° °Organization         Address  Phone   Notes  °Sickle Cell Patients, [facility] [address], [place] (336) 832-1970   °[facility] [address], [place] (336) 832-4400   °[facility] [place] ° [address],  (336) 992-4800   °[facility]/Dr. Osei-Bonsu ° [address] [place] [address], [place] or [address], [place] (336) 841-8500 Phone number for both [place] and [place] locations is the same.  °[facility] [address],  [place] (336) 299-0000   °[facility] [address] [place] [address], [place] or [address] (336) 852-7530 °(336) 878-2260   °[facility] [address], [place] (336) 350-1642, phone; (336) 294-5005, fax Sees patients 1st and 3rd Saturday of every month.  Must not qualify for public or private insurance (i.e. Medicaid, Medicare, [place] Health Choice, Veterans' Benefits) • Household income should be no more than 200% of the poverty level •The clinic cannot treat you if you are pregnant or think you are pregnant • Sexually transmitted diseases are not treated at the clinic.  ° ° °Dental Care: °Organization         Address  Phone  Notes  °[place] [facility] [address], [place] (336) 641-6152 Accepts children up to age 21 who are enrolled in Medicaid or [place] Health Choice; pregnant women with a Medicaid card; and children who have applied for Medicaid or [place] Health Choice, but were declined, whose parents can pay a reduced fee at time of service.  °[facility] [place]  [address], [place] (336) 641-7733 Accepts children up to age 21 who are enrolled in Medicaid or [place] Health Choice; pregnant women with a Medicaid card; and children who have applied for Medicaid or [place] Health Choice, but were declined, whose parents can pay a reduced fee at time of service.  °[facility] ° [address], [place] (336) 641-4533 Patients are seen by appointment only. Walk-ins are not accepted. [facility] will see patients 18 years of age and older. °Monday - Tuesday (8am-5pm) °Most Wednesdays (8:30-5pm) °$30 per visit, cash only  °[facility] ° [address], [place] (336) 641-4533 Patients are seen by appointment only. Walk-ins are not accepted. [facility] will see patients 18 years of age and older. °One Wednesday Evening  (Monthly: Volunteer Based).  $30 per visit, cash only  °[facility]  (919) 537-3737 for adults; Children under age 4, call Graduate Pediatric Dentistry at (919) [phone]. Children aged 4-14, please call (919) [phone] to request a pediatric application. ° Dental services are provided in all areas of dental care including fillings, crowns and bridges, complete and partial dentures, implants, gum treatment, root canals, and extractions. Preventive care is also provided. Treatment is provided to both adults and children. °Patients are selected via a lottery and there is often a waiting list. °  °[facility] [address], °[place] ° (336) 763-8833 [email or web address] °  °[facility] [address], [place], [place] (336)723-1848, Ext. 123 Second and Fourth Thursday of each month, opens at 6:30 AM; Clinic ends at 9 AM.  Patients are seen on a first-come first-served basis, and a limited number are seen during each clinic.  ° °[facility] ° [address], [place], [place] (336) 723-7904   Eligibility Requirements °You must have lived in [place], or [place] counties for at least the last three months. °  You cannot be eligible for state or federal sponsored healthcare insurance, including Veterans Administration, Medicaid, or Medicare. °  You generally cannot be eligible for healthcare insurance through your employer.  °  How to apply: °Eligibility screenings are held every Tuesday and Wednesday afternoon from 1:00 pm until 4:00 pm. You do not need an appointment for the interview!  °[facility] [address], [place], [place] 336-631-2330   °[facility]  336-342-8273   °[facility]  336-703-3100   °[facility]  336-570-6415   ° °Behavioral Health Resources in the Community: °Intensive Outpatient Programs °Organization         Address  Phone  Notes  °[place]  [facility] [address], [place], [place] 336-878-6098   °[facility] [address], [place], [place] 336-832-9800   °[facility] [address], [place], [place] ° 336-882-2125   °[facility] [address],  °[place], [place] 1-800-853-5163 or 336-641-4981   °Substance Abuse Resources °Organization         Address  Phone  Notes  °[facility]  336-882-2125   °[facility]  336-784-9470   °[facility]  336-285-9073   °[facility]  336-845-3988   °Residential & Outpatient Substance Abuse Program  1-800-659-3381   °Psychological Services °Organization         Address  Phone  Notes  °[facility]  336- 832-9600   °[facility]  336- 378-7881   °[facility] [address], [place] 1-800-853-5163 or 336-641-4981   ° °Mobile Crisis Teams °Organization         Address  Phone  Notes  °[facility], Mobile Crisis Care Unit  1-877-626-1772   °[facility]ive °[facility] ° [address] [place], [place] 336-834-9664   °Sharon DeEsch [address] °[place] 336-554-5454   ° °Self-Help/Support Groups °Organization         Address  Phone             Notes  °[facility] [place] - variety of support groups  336- 373-1402 Call for more information  °Narcotics Anonymous (NA), [facility] [address], °[place]  2 meetings at this location  ° °Residential Treatment Programs °Organization         Address  Phone  Notes  °[facility] [address],    °[place]  1-866-801-8205   °[facility] ° [address], [place], [place] 704-293-8524   °[facility] [address], [place] 336-845-3988 Admissions: 8am-3pm M-F  °[facility] [address]    °[place], [place] 336-841-1104   °[facility] [address],  [place], [place] 336-379-7146   °[facility] [address]  °[place],  336-285-9073   °[facility] - Intensive Outpatient [address], ,   [place] 336-852-3033   °[facility] [address]  °[place], [place] 1-877-615-2722 or 336-784-9470   °[facility] ([facility]) [address] [place], [place] 336-227-7417 Accepts Medicaid  °[facility] [address]  °[place] 1-800-659-3381 Substance Abuse/Addiction Treatment  ° °[place] Behavioral Health Resources °Organization         Address  Phone  Notes  °[facility]  (888) 581-9988   °Julie Brannon, PhD [address] [place], [place]   (336) 349-5553 or (336) 951-0000   °[facility]   [address] °[place], [place] (336) 349-4454   °[facility] [address], [place], [place] (336) 342-8316 Insurance/Medicaid/sponsorship through [facility]  °[facility] [address]                                    [place], [place] (336) 342-8316 Therapy/tele-psych/case  °[facility] [address]  ° [place], [place] (336) 349-2233    °Dr. Arfeen  (336) 349-4544   °[facility] [place]  [employer] [place] [facility] 1) [address], [place] °2) [address], [place] °3)  [address], [place] (336) 349-3220 °(336) 342-7768 ° °(336) 342-8140   °[place] [facility] (336) 342-1394 or (336) 342-3537 (After Hours)    ° ° °

## 2014-03-29 NOTE — ED Provider Notes (Signed)
CSN: [id number]     Arrival date & time 03/29/14  1611 History  This chart was scribed for non-physician practitioner, Junious Silk, PA-C working with Rolland Porter, MD by Greggory Stallion, ED scribe. This patient was seen in room [facility]3/[facility]3 and the patient's care was started at 5:26 PM.   Chief Complaint  Patient presents with  . detox    The history is provided by the patient. No language interpreter was used.   HPI Comments: [name] is a 33 y.o. female who presents to the Emergency Department requesting detox from cocaine and opiates. Her last use of cocaine was 2 days ago and last use of opiates was earlier today. Reports having two Vicodin earlier today. States she normally takes 9-10 Roxicodone pills per day and buys them off the street. Pt had two 1 mg xanax 2 days ago but states she doesn't use it daily. Reports only taking it a few times per week. Pt states she only takes the drugs because of stress and family triggers. She tried to go to [facility] 2 days ago for detox and was given klonopin, robaxin, phenergan and outpatient follow up. Reports several episodes of diarrhea today. Denies alcohol use. Denies SI/HI, nausea, emesis.   Past Medical History  Diagnosis Date  . Anemia   . Anxiety   . Depression   . Arthritis     hands, feet, elbows, knees  . Lupus    Past Surgical History  Procedure Laterality Date  . Septoplasty  04/2005  . Diagnostic laparoscopy  01/2010    w/LOA  . Wisdom tooth extraction    . Svd      X 2  . Vaginal hysterectomy  08/13/2011    Procedure: HYSTERECTOMY VAGINAL;  Surgeon: Zenaida Niece, MD;  Location: [facility];  Service: Gynecology;  Laterality: N/A;  . Cystoscopy  08/13/2011    Procedure: CYSTOSCOPY;  Surgeon: Zenaida Niece, MD;  Location: [facility];  Service: Gynecology;  Laterality: N/A;  . Abdominal hysterectomy     Family History  Problem Relation Age of Onset  . Rheum arthritis Other    History  Substance Use Topics  .  Smoking status: Current Every Day Smoker -- 0.50 packs/day for 15 years    Types: Cigarettes  . Smokeless tobacco: Never Used  . Alcohol Use: No   OB History   Grav Para Term Preterm Abortions TAB SAB Ect Mult Living                 Review of Systems  Gastrointestinal: Positive for diarrhea. Negative for nausea and vomiting.  Psychiatric/Behavioral: Negative for suicidal ideas.  All other systems reviewed and are negative.  Allergies  Iron  Home Medications   Prior to Admission medications   Medication Sig Start Date End Date Taking? Authorizing Provider  clonazePAM (KLONOPIN) 1 MG tablet Take 1 mg by mouth 3 (three) times daily.   Yes Historical Provider, MD  methocarbamol (ROBAXIN) 750 MG tablet Take 1,500 mg by mouth 3 (three) times daily.   Yes Historical Provider, MD  promethazine (PHENERGAN) 25 MG tablet Take 50 mg by mouth every 4 (four) hours as needed for nausea or vomiting.   Yes Historical Provider, MD   BP 127/84  Pulse 81  Temp(Src) 97.7 F (36.5 C) (Oral)  Resp 18  SpO2 100%  LMP 07/29/2011  Physical Exam  Nursing note and vitals reviewed. Constitutional: She is oriented to person, place, and time. She appears well-developed and  well-nourished. No distress.  HENT:  Head: Normocephalic and atraumatic.  Right Ear: External ear normal.  Left Ear: External ear normal.  Nose: Nose normal.  Mouth/Throat: Oropharynx is clear and moist.  Eyes: Conjunctivae are normal.  Neck: Normal range of motion.  Cardiovascular: Normal rate, regular rhythm and normal heart sounds.   Pulmonary/Chest: Effort normal and breath sounds normal. No stridor. No respiratory distress. She has no wheezes. She has no rales.  Abdominal: Soft. She exhibits no distension.  Musculoskeletal: Normal range of motion.  Neurological: She is alert and oriented to person, place, and time. She has normal strength.  Skin: Skin is warm and dry. She is not diaphoretic. No erythema.  Psychiatric: She  has a normal mood and affect. Her behavior is normal. She expresses no homicidal and no suicidal ideation. She expresses no suicidal plans and no homicidal plans.    ED Course  Procedures (including critical care time)  DIAGNOSTIC STUDIES: Oxygen Saturation is 100% on RA, normal by my interpretation.    COORDINATION OF CARE: 5:30 PM-Advised pt there are no inpatient detox centers for cocaine or opiates. Discussed treatment plan which includes speaking with behavioral health and getting outpatient resources with pt at bedside and pt agreed to plan.   Labs Review Labs Reviewed  CBC WITH DIFFERENTIAL  COMPREHENSIVE METABOLIC PANEL  ETHANOL  URINE RAPID DRUG SCREEN (HOSP PERFORMED)  SALICYLATE LEVEL  ACETAMINOPHEN LEVEL  POC URINE PREG, ED    Imaging Review No results found.   EKG Interpretation None      6:14 PM Discussed case with Leeanne Mannan at [facility].   6:31 PM Attempted to call patient. Voicemail full and unable to leave patient message.   6:38 PM Again attempted to call patient. No answer.   6:42 PM Was able to get in touch with patient. Discussed that with current providers she would be able to get placed in [facility]. Cannot guarantee this in the future. She understands my shift ends at [facility] tonight.   MDM   Final diagnoses:  Polysubstance abuse    Patient presents to ED for detox from opiates and cocaine. Initially discussed with patient that we no longer offer these services. I discussed this with [place] who was able to give patient a resource guide. Patient has medicaid and only available opportunity for detox is [facility]. Patient called [facility] and was told she cannot get a bed there unless she comes from an emergency department. I had a long discussion with Leeanne Mannan at [facility]. She reports she must have full panel of blood work and referral. Again discussed with [place] and social work. Social worker to fill out screening paperwork and get patient expeditiously get patient to  [facility]. Patient left prior to my return. Called patient on phone. Encouraged patient to return during my shift. Patient did not return. No SI/HI. Patient is appropriate for discharge. Patient / Family / Caregiver informed of clinical course, understand medical decision-making process, and agree with plan.    I personally performed the services described in this documentation, which was scribed in my presence. The recorded information has been reviewed and is accurate.  Mora Bellman, PA-C 03/29/14 2050

## 2014-03-29 NOTE — ED Notes (Signed)
Per pt, states she wants detox from cocaine and opiates-last use of cocaine was Sunday and opiates today

## 2014-04-03 NOTE — ED Provider Notes (Signed)
Medical screening examination/treatment/procedure(s) were performed by non-physician practitioner and as supervising physician I was immediately available for consultation/collaboration.   EKG Interpretation None        Ila Landowski, MD 04/03/14 0805 

## 2014-04-05 ENCOUNTER — Encounter (HOSPITAL_COMMUNITY): Payer: Self-pay | Admitting: Emergency Medicine

## 2014-04-05 ENCOUNTER — Emergency Department (HOSPITAL_COMMUNITY)
Admission: EM | Admit: 2014-04-05 | Discharge: 2014-04-05 | Disposition: A | Discharge status: Home / Self Care | Payer: Medicaid Other | Attending: Emergency Medicine | Admitting: Emergency Medicine

## 2014-04-05 DIAGNOSIS — F1410 Cocaine abuse, uncomplicated: Secondary | ICD-10-CM | POA: Insufficient documentation

## 2014-04-05 DIAGNOSIS — Z862 Personal history of diseases of the blood and blood-forming organs and certain disorders involving the immune mechanism: Secondary | ICD-10-CM | POA: Insufficient documentation

## 2014-04-05 DIAGNOSIS — F1920 Other psychoactive substance dependence, uncomplicated: Principal | ICD-10-CM

## 2014-04-05 DIAGNOSIS — F411 Generalized anxiety disorder: Secondary | ICD-10-CM | POA: Diagnosis not present

## 2014-04-05 DIAGNOSIS — F1310 Sedative, hypnotic or anxiolytic abuse, uncomplicated: Secondary | ICD-10-CM | POA: Diagnosis not present

## 2014-04-05 DIAGNOSIS — Z79899 Other long term (current) drug therapy: Secondary | ICD-10-CM | POA: Diagnosis not present

## 2014-04-05 DIAGNOSIS — F17200 Nicotine dependence, unspecified, uncomplicated: Secondary | ICD-10-CM | POA: Insufficient documentation

## 2014-04-05 DIAGNOSIS — Z8739 Personal history of other diseases of the musculoskeletal system and connective tissue: Secondary | ICD-10-CM | POA: Diagnosis not present

## 2014-04-05 DIAGNOSIS — Z0489 Encounter for examination and observation for other specified reasons: Secondary | ICD-10-CM | POA: Diagnosis present

## 2014-04-05 DIAGNOSIS — F1910 Other psychoactive substance abuse, uncomplicated: Secondary | ICD-10-CM

## 2014-04-05 DIAGNOSIS — F1120 Opioid dependence, uncomplicated: Secondary | ICD-10-CM | POA: Insufficient documentation

## 2014-04-05 HISTORY — DX: Other psychoactive substance abuse, uncomplicated: F19.10

## 2014-04-05 LAB — CBC
HCT: 36.7 % (ref 36.0–46.0)
Hemoglobin: 11.9 g/dL — ABNORMAL LOW (ref 12.0–15.0)
MCH: 26.6 pg (ref 26.0–34.0)
MCHC: 32.4 g/dL (ref 30.0–36.0)
MCV: 81.9 fL (ref 78.0–100.0)
PLATELETS: 221 10*3/uL (ref 150–400)
RBC: 4.48 MIL/uL (ref 3.87–5.11)
RDW: 15.1 % (ref 11.5–15.5)
WBC: 6 10*3/uL (ref 4.0–10.5)

## 2014-04-05 LAB — ACETAMINOPHEN LEVEL

## 2014-04-05 LAB — COMPREHENSIVE METABOLIC PANEL
ALBUMIN: 3.7 g/dL (ref 3.5–5.2)
ALK PHOS: 72 U/L (ref 39–117)
ALT: 17 U/L (ref 0–35)
AST: 18 U/L (ref 0–37)
Anion gap: 14 (ref 5–15)
BILIRUBIN TOTAL: 0.2 mg/dL — AB (ref 0.3–1.2)
BUN: 11 mg/dL (ref 6–23)
CHLORIDE: 102 meq/L (ref 96–112)
CO2: 22 meq/L (ref 19–32)
CREATININE: 0.74 mg/dL (ref 0.50–1.10)
Calcium: 9.5 mg/dL (ref 8.4–10.5)
GFR calc Af Amer: >90 mL/min (ref >90)
Glucose, Bld: 111 mg/dL — ABNORMAL HIGH (ref 70–99)
POTASSIUM: 4.2 meq/L (ref 3.7–5.3)
Sodium: 138 mEq/L (ref 137–147)
Total Protein: 7.7 g/dL (ref 6.0–8.3)

## 2014-04-05 LAB — RAPID URINE DRUG SCREEN, HOSP PERFORMED
AMPHETAMINES: NOT DETECTED
BARBITURATES: NOT DETECTED
BENZODIAZEPINES: POSITIVE — AB
COCAINE: POSITIVE — AB
Opiates: POSITIVE — AB
TETRAHYDROCANNABINOL: NOT DETECTED

## 2014-04-05 LAB — ETHANOL: Alcohol, Ethyl (B): <11 mg/dL (ref 0–11)

## 2014-04-05 LAB — SALICYLATE LEVEL: Salicylate Lvl: <2 mg/dL — ABNORMAL LOW (ref 2.8–20.0)

## 2014-04-05 NOTE — ED Provider Notes (Signed)
CSN: [id number]     Arrival date & time 04/05/14  1420 History   First MD Initiated Contact with Patient 04/05/14 1624     Chief Complaint  Patient presents with  . Drug / Alcohol Assessment     (Consider location/radiation/quality/duration/timing/severity/associated sxs/prior Treatment) HPI Pt is a 33yo female with hx of anxiety, depression, Lupus, anemia, and polysubstance abuse presenting to ED requesting medical clearance referral to [facility] as she states there is a woman's bed open at this time.  Pt states she would like detox from opiates and cocaine.  Last opiate use was yesterday.  Pt was seen in ED for same last week.  Advised she buys her pills off the street and takes about 9-10 Roxicodone per day.  Denies use of etoh. Denies SI or HI.  Denies any other concerns or complaints at this time.    Past Medical History  Diagnosis Date  . Anemia   . Anxiety   . Depression   . Arthritis     hands, feet, elbows, knees  . Lupus   . Polysubstance abuse    Past Surgical History  Procedure Laterality Date  . Septoplasty  04/2005  . Diagnostic laparoscopy  01/2010    w/LOA  . Wisdom tooth extraction    . Svd      X 2  . Vaginal hysterectomy  08/13/2011    Procedure: HYSTERECTOMY VAGINAL;  Surgeon: Zenaida Nieceodd D Meisinger, MD;  Location: [facility];  Service: Gynecology;  Laterality: N/A;  . Cystoscopy  08/13/2011    Procedure: CYSTOSCOPY;  Surgeon: Zenaida Nieceodd D Meisinger, MD;  Location: [facility];  Service: Gynecology;  Laterality: N/A;  . Abdominal hysterectomy     Family History  Problem Relation Age of Onset  . Rheum arthritis Other    History  Substance Use Topics  . Smoking status: Current Every Day Smoker -- 0.50 packs/day for 15 years    Types: Cigarettes  . Smokeless tobacco: Never Used  . Alcohol Use: No   OB History   Grav Para Term Preterm Abortions TAB SAB Ect Mult Living                 Review of Systems  Constitutional: Negative for fever and chills.  Respiratory:  Negative for cough and shortness of breath.   Cardiovascular: Negative for chest pain and palpitations.  Gastrointestinal: Negative for nausea, vomiting, abdominal pain and diarrhea.  Genitourinary: Negative for dysuria, urgency, hematuria and flank pain.  All other systems reviewed and are negative.     Allergies  Iron  Home Medications   Prior to Admission medications   Medication Sig Start Date End Date Taking? Authorizing Provider  clonazePAM (KLONOPIN) 1 MG tablet Take 1 mg by mouth 3 (three) times daily.    Historical Provider, MD  methocarbamol (ROBAXIN) 750 MG tablet Take 1,500 mg by mouth 3 (three) times daily.    Historical Provider, MD   BP 121/78  Pulse 87  Temp(Src) 98.2 F (36.8 C) (Oral)  Resp 17  SpO2 99%  LMP 07/29/2011 Physical Exam  Nursing note and vitals reviewed. Constitutional: She is oriented to person, place, and time. She appears well-developed and well-nourished.  HENT:  Head: Normocephalic and atraumatic.  Eyes: EOM are normal.  Neck: Normal range of motion.  Cardiovascular: Normal rate and normal heart sounds.   Pulmonary/Chest: Effort normal and breath sounds normal. No respiratory distress. She has no wheezes.  Abdominal: Soft. She exhibits no distension. There is no tenderness.  Musculoskeletal: Normal range of motion.  Neurological: She is alert and oriented to person, place, and time.  Skin: Skin is warm and dry.  Psychiatric: She has a normal mood and affect. Her speech is normal and behavior is normal. She expresses no homicidal and no suicidal ideation. She expresses no suicidal plans and no homicidal plans.    ED Course  Procedures (including critical care time) Labs Review Labs Reviewed  CBC - Abnormal; Notable for the following:    Hemoglobin 11.9 (*)    All other components within normal limits  COMPREHENSIVE METABOLIC PANEL - Abnormal; Notable for the following:    Glucose, Bld 111 (*)    Total Bilirubin 0.2 (*)    All  other components within normal limits  SALICYLATE LEVEL - Abnormal; Notable for the following:    Salicylate Lvl <2.0 (*)    All other components within normal limits  URINE RAPID DRUG SCREEN (HOSP PERFORMED) - Abnormal; Notable for the following:    Opiates POSITIVE (*)    Cocaine POSITIVE (*)    Benzodiazepines POSITIVE (*)    All other components within normal limits  ACETAMINOPHEN LEVEL  ETHANOL    Imaging Review No results found.   EKG Interpretation None      MDM   Final diagnoses:  Opiate dependence, continuous  Polysubstance abuse    Pt is a 33yo female presenting to ED requesting referral of medical clearance for a [facility] in [place] as she states they do have a woman's bed open.  Denies SI/HI.  Urine drug screen positive for opiates, cocaine, and benzodiazepines.  Labs otherwise unremarkable.  Pt is medically cleared to be discharged to f/u with [facility] in [place].  Return precautions and resource guide also provided. Pt verbalized understanding and agreement with tx plan.     Junius Finner, PA-C 04/05/14 1826

## 2014-04-05 NOTE — ED Provider Notes (Signed)
Medical screening examination/treatment/procedure(s) were performed by non-physician practitioner and as supervising physician I was immediately available for consultation/collaboration.   EKG Interpretation None        Layla MawKristen N Ermel Verne, DO 04/05/14 2320

## 2014-04-05 NOTE — ED Notes (Signed)
Pt requesting detox from opiates and cocaine.  Last opiate use was yesterday.  Pt reports being seen here for same last week and was sent home.

## 2014-04-05 NOTE — ED Notes (Signed)
Pt awaiting placement at [facility], paperwork has been faxed, and bed are available, awaiting to hear from [facility]. Pt advised of this.

## 2014-04-05 NOTE — Discharge Instructions (Signed)
Drug Abuse and Addiction in Sports °There are many types of drugs that one may become addicted to including illegal drugs (marijuana, cocaine, amphetamines, hallucinogens, and narcotics), prescription drugs (hydrocodone, codeine, and alprazolam), and other chemicals such as alcohol or nicotine. °Two types of addiction exist: physical and emotional. Physical addiction usually occurs after prolonged use of a drug. However, some drugs may only take a couple uses before addiction can occur. Physical addiction is marked by withdrawal symptoms in which the person experiences negative symptoms such as sweat, anxiety, tremors, hallucinations, or cravings in the absence of using the drug. Emotional dependence is the psychological desire for the "high" that the drugs produce when taken. °SYMPTOMS  °· Inattentiveness. °· Negligence. °· Forgetfulness. °· Insomnia. °· Mood swings. °RISK INCREASES WITH:  °· Family history of addiction. °· Personal history of addictive personality. °Studies have shown that risk takers, which many athletes are, have a higher risk of addiction. °PREVENTION °The only adequate prevention of drug abuse is abstinence from drugs. °TREATMENT  °The first step in quitting substance abuse is recognizing the problem and realizing that one has the power to change. Quitting requires a plan and support from others. It is often necessary to seek medical assistance. Caregivers are available to offer counseling, and for certain cases, medicine to diminish the physical symptoms of withdrawal. Many organizations exist such as Alcoholics Anonymous, Narcotics Anonymous, or the National Council on Alcoholism that offer support for individuals who have chosen to quit their habits. °Document Released: 06/24/2005 Document Revised: 11/08/2013 Document Reviewed: 10/06/2008 °ExitCare® Patient Information ©2015 [employer]. This information is not intended to replace advice given to you by your health care provider. Make  sure you discuss any questions you have with your health care provider. ° °

## 2014-09-13 ENCOUNTER — Encounter (HOSPITAL_BASED_OUTPATIENT_CLINIC_OR_DEPARTMENT_OTHER): Payer: Self-pay | Admitting: Emergency Medicine

## 2014-09-13 ENCOUNTER — Emergency Department (HOSPITAL_BASED_OUTPATIENT_CLINIC_OR_DEPARTMENT_OTHER)
Admission: EM | Admit: 2014-09-13 | Discharge: 2014-09-13 | Discharge status: Against Medical Advice | Payer: Medicaid Other | Attending: Emergency Medicine | Admitting: Emergency Medicine

## 2014-09-13 DIAGNOSIS — J029 Acute pharyngitis, unspecified: Secondary | ICD-10-CM | POA: Insufficient documentation

## 2014-09-13 DIAGNOSIS — Z720 Tobacco use: Secondary | ICD-10-CM | POA: Diagnosis not present

## 2014-09-13 LAB — RAPID STREP SCREEN (MED CTR MEBANE ONLY): STREPTOCOCCUS, GROUP A SCREEN (DIRECT): NEGATIVE

## 2014-09-13 NOTE — ED Notes (Signed)
Pt smells of cigarettes, pt states she smokes.

## 2014-09-13 NOTE — ED Notes (Signed)
Pt states she woke up in a lot of pain around 4am with sore throat.

## 2014-09-20 LAB — CULTURE, GROUP A STREP

## 2016-01-24 ENCOUNTER — Encounter: Payer: Self-pay | Admitting: *Deleted

## 2016-01-24 NOTE — Progress Notes (Signed)
This encounter was created in error - please disregard.

## 2017-03-11 ENCOUNTER — Emergency Department (HOSPITAL_COMMUNITY)
Admission: EM | Admit: 2017-03-11 | Discharge: 2017-03-11 | Disposition: A | Discharge status: Home / Self Care | Payer: Medicaid Other | Attending: Emergency Medicine | Admitting: Emergency Medicine

## 2017-03-11 ENCOUNTER — Encounter (HOSPITAL_COMMUNITY): Payer: Self-pay | Admitting: Emergency Medicine

## 2017-03-11 DIAGNOSIS — R0602 Shortness of breath: Secondary | ICD-10-CM | POA: Insufficient documentation

## 2017-03-11 DIAGNOSIS — Z5321 Procedure and treatment not carried out due to patient leaving prior to being seen by health care provider: Secondary | ICD-10-CM | POA: Diagnosis not present

## 2017-03-11 LAB — BASIC METABOLIC PANEL
Anion gap: 13 (ref 5–15)
BUN: 9 mg/dL (ref 6–20)
CALCIUM: 8.8 mg/dL — AB (ref 8.9–10.3)
CO2: 20 mmol/L — ABNORMAL LOW (ref 22–32)
CREATININE: 0.72 mg/dL (ref 0.44–1.00)
Chloride: 103 mmol/L (ref 101–111)
GFR calc Af Amer: >60 mL/min (ref >60)
GLUCOSE: 140 mg/dL — AB (ref 65–99)
POTASSIUM: 3.9 mmol/L (ref 3.5–5.1)
Sodium: 136 mmol/L (ref 135–145)

## 2017-03-11 LAB — CBC
HEMATOCRIT: 32.4 % — AB (ref 36.0–46.0)
Hemoglobin: 10.4 g/dL — ABNORMAL LOW (ref 12.0–15.0)
MCH: 26.9 pg (ref 26.0–34.0)
MCHC: 32.1 g/dL (ref 30.0–36.0)
MCV: 83.7 fL (ref 78.0–100.0)
PLATELETS: 195 10*3/uL (ref 150–400)
RBC: 3.87 MIL/uL (ref 3.87–5.11)
RDW: 14.9 % (ref 11.5–15.5)
WBC: 5.4 10*3/uL (ref 4.0–10.5)

## 2017-03-11 NOTE — ED Triage Notes (Addendum)
Pt went to MD office today for SOB over the weekend. Pt states she has not been on a long trip, is not on birth control but does smoke. Pt's MD concerned for PE so sent her here. Pt able to talk in complete sentences. Pt states pain goes into her back when she takes deep breath. Pt has hx of lupus

## 2017-03-11 NOTE — ED Notes (Signed)
Per tech pt told her she was leaving

## 2018-04-23 ENCOUNTER — Emergency Department (HOSPITAL_BASED_OUTPATIENT_CLINIC_OR_DEPARTMENT_OTHER)
Admission: EM | Admit: 2018-04-23 | Discharge: 2018-04-23 | Discharge status: Absent without leave | Payer: Medicaid Other

## 2024-06-03 ENCOUNTER — Emergency Department (HOSPITAL_COMMUNITY)
Admission: EM | Admit: 2024-06-03 | Discharge: 2024-06-03 | Disposition: A | Discharge status: Home / Self Care | Payer: MEDICAID | Attending: Emergency Medicine | Admitting: Emergency Medicine

## 2024-06-03 ENCOUNTER — Other Ambulatory Visit: Payer: Self-pay

## 2024-06-03 ENCOUNTER — Encounter (HOSPITAL_COMMUNITY): Payer: Self-pay

## 2024-06-03 ENCOUNTER — Emergency Department (HOSPITAL_COMMUNITY): Payer: MEDICAID

## 2024-06-03 DIAGNOSIS — K5900 Constipation, unspecified: Secondary | ICD-10-CM | POA: Insufficient documentation

## 2024-06-03 DIAGNOSIS — E860 Dehydration: Secondary | ICD-10-CM | POA: Insufficient documentation

## 2024-06-03 DIAGNOSIS — R112 Nausea with vomiting, unspecified: Secondary | ICD-10-CM

## 2024-06-03 DIAGNOSIS — R1084 Generalized abdominal pain: Secondary | ICD-10-CM | POA: Diagnosis present

## 2024-06-03 DIAGNOSIS — K529 Noninfective gastroenteritis and colitis, unspecified: Secondary | ICD-10-CM | POA: Insufficient documentation

## 2024-06-03 DIAGNOSIS — N390 Urinary tract infection, site not specified: Secondary | ICD-10-CM | POA: Diagnosis not present

## 2024-06-03 DIAGNOSIS — R109 Unspecified abdominal pain: Secondary | ICD-10-CM

## 2024-06-03 DIAGNOSIS — R197 Diarrhea, unspecified: Secondary | ICD-10-CM

## 2024-06-03 LAB — URINALYSIS, W/ REFLEX TO CULTURE (INFECTION SUSPECTED)
Bilirubin Urine: NEGATIVE
Glucose, UA: NEGATIVE mg/dL
Hgb urine dipstick: NEGATIVE
Ketones, ur: NEGATIVE mg/dL
Leukocytes,Ua: NEGATIVE
Nitrite: NEGATIVE
Protein, ur: NEGATIVE mg/dL
Specific Gravity, Urine: >1.046 — ABNORMAL HIGH (ref 1.005–1.030)
pH: 6 (ref 5.0–8.0)

## 2024-06-03 LAB — CBC WITH DIFFERENTIAL/PLATELET
Abs Immature Granulocytes: 0.03 K/uL (ref 0.00–0.07)
Basophils Absolute: 0 K/uL (ref 0.0–0.1)
Basophils Relative: 0 %
Eosinophils Absolute: 0.6 K/uL — ABNORMAL HIGH (ref 0.0–0.5)
Eosinophils Relative: 5 %
HCT: 44.6 % (ref 36.0–46.0)
Hemoglobin: 15.3 g/dL — ABNORMAL HIGH (ref 12.0–15.0)
Immature Granulocytes: 0 %
Lymphocytes Relative: 20 %
Lymphs Abs: 2.7 K/uL (ref 0.7–4.0)
MCH: 29 pg (ref 26.0–34.0)
MCHC: 34.3 g/dL (ref 30.0–36.0)
MCV: 84.5 fL (ref 80.0–100.0)
Monocytes Absolute: 0.6 K/uL (ref 0.1–1.0)
Monocytes Relative: 4 %
Neutro Abs: 9.4 K/uL — ABNORMAL HIGH (ref 1.7–7.7)
Neutrophils Relative %: 71 %
Platelets: 305 K/uL (ref 150–400)
RBC: 5.28 MIL/uL — ABNORMAL HIGH (ref 3.87–5.11)
RDW: 13.5 % (ref 11.5–15.5)
WBC: 13.3 K/uL — ABNORMAL HIGH (ref 4.0–10.5)
nRBC: 0 % (ref 0.0–0.2)

## 2024-06-03 LAB — COMPREHENSIVE METABOLIC PANEL WITH GFR
ALT: 22 U/L (ref 0–44)
AST: 20 U/L (ref 15–41)
Albumin: 3.8 g/dL (ref 3.5–5.0)
Alkaline Phosphatase: 79 U/L (ref 38–126)
Anion gap: 13 (ref 5–15)
BUN: 12 mg/dL (ref 6–20)
CO2: 20 mmol/L — ABNORMAL LOW (ref 22–32)
Calcium: 9.1 mg/dL (ref 8.9–10.3)
Chloride: 101 mmol/L (ref 98–111)
Creatinine, Ser: 0.66 mg/dL (ref 0.44–1.00)
GFR, Estimated: >60 mL/min (ref >60)
Glucose, Bld: 125 mg/dL — ABNORMAL HIGH (ref 70–99)
Potassium: 3.2 mmol/L — ABNORMAL LOW (ref 3.5–5.1)
Sodium: 134 mmol/L — ABNORMAL LOW (ref 135–145)
Total Bilirubin: 0.4 mg/dL (ref 0.0–1.2)
Total Protein: 7 g/dL (ref 6.5–8.1)

## 2024-06-03 LAB — LIPASE, BLOOD: Lipase: 10 U/L — ABNORMAL LOW (ref 11–51)

## 2024-06-03 LAB — I-STAT CG4 LACTIC ACID, ED: Lactic Acid, Venous: 2 mmol/L (ref 0.5–1.9)

## 2024-06-03 MED ORDER — HYDROMORPHONE HCL 1 MG/ML IJ SOLN
1.0000 mg | Freq: Once | INTRAMUSCULAR | Status: AC
  Administered 2024-06-03: 1 mg via INTRAVENOUS
  Filled 2024-06-03: qty 1

## 2024-06-03 MED ORDER — IOHEXOL 300 MG/ML  SOLN
100.0000 mL | Freq: Once | INTRAMUSCULAR | Status: AC | PRN
  Administered 2024-06-03: 100 mL via INTRAVENOUS

## 2024-06-03 MED ORDER — ONDANSETRON 4 MG PO TBDP: 4.0000 mg | ORAL_TABLET | Freq: Three times a day (TID) | ORAL | 0 refills | Status: AC | PRN

## 2024-06-03 MED ORDER — AMOXICILLIN-POT CLAVULANATE 875-125 MG PO TABS
1.0000 | ORAL_TABLET | Freq: Once | ORAL | Status: AC
  Administered 2024-06-03: 1 via ORAL
  Filled 2024-06-03: qty 1

## 2024-06-03 MED ORDER — POTASSIUM CHLORIDE CRYS ER 20 MEQ PO TBCR
20.0000 meq | EXTENDED_RELEASE_TABLET | Freq: Once | ORAL | Status: AC
  Administered 2024-06-03: 20 meq via ORAL
  Filled 2024-06-03: qty 1

## 2024-06-03 MED ORDER — SODIUM CHLORIDE 0.9 % IV BOLUS
1000.0000 mL | Freq: Once | INTRAVENOUS | Status: AC
  Administered 2024-06-03: 1000 mL via INTRAVENOUS

## 2024-06-03 MED ORDER — OXYCODONE HCL 5 MG PO TABS: 5.0000 mg | ORAL_TABLET | ORAL | 0 refills | Status: AC | PRN

## 2024-06-03 MED ORDER — AMOXICILLIN-POT CLAVULANATE 875-125 MG PO TABS: 1.0000 | ORAL_TABLET | Freq: Two times a day (BID) | ORAL | 0 refills | Status: AC

## 2024-06-03 MED ORDER — ONDANSETRON HCL 4 MG/2ML IJ SOLN
4.0000 mg | Freq: Once | INTRAMUSCULAR | Status: AC
  Administered 2024-06-03: 4 mg via INTRAVENOUS
  Filled 2024-06-03: qty 2

## 2024-06-03 NOTE — ED Notes (Signed)
 Pt to CT

## 2024-06-03 NOTE — ED Triage Notes (Signed)
 Pt BIB ems for abdominal pain for 4 days along with nausea and diarrhea. Pt states she had a bowl obstruction 15 years ago that she had surgery for and she states this pain and symptoms feel like the obstruction. Also experiences heartburn and acid reflux, unable to keep anything down. VS stable, A&Ox4

## 2024-06-03 NOTE — ED Notes (Signed)
 IV team at bedside

## 2024-06-03 NOTE — ED Provider Notes (Signed)
 [place] EMERGENCY DEPARTMENT AT [facility] Provider Note   CSN: [id number] Arrival date & time: 06/03/24  8365     Patient presents with: No chief complaint on file.   [name] is a 43 y.o. female.   The history is provided by the patient and medical records. No language interpreter was used.  Abdominal Pain Pain location:  Generalized Pain quality: aching, bloating, cramping and sharp   Pain radiates to:  Does not radiate Pain severity:  Severe Onset quality:  Gradual Duration:  2 weeks Timing:  Constant Progression:  Worsening Chronicity:  Recurrent Context: previous surgery   Relieved by:  Nothing Worsened by:  Palpation and eating Ineffective treatments:  None tried Associated symptoms: constipation, nausea and vomiting   Associated symptoms: no chest pain, no chills, no cough, no diarrhea, no dysuria, no fatigue, no fever, no flatus, no shortness of breath, no vaginal bleeding and no vaginal discharge        Prior to Admission medications   Medication Sig Start Date End Date Taking? Authorizing Provider  clonazePAM (KLONOPIN) 1 MG tablet Take 1 mg by mouth 3 (three) times daily.    [provider]  methocarbamol (ROBAXIN) 750 MG tablet Take 1,500 mg by mouth 3 (three) times daily.    [provider]    Allergies: Acetaminophen , Ferrous sulfate, and Iron    Review of Systems  Constitutional:  Negative for chills, fatigue and fever.  HENT:  Negative for congestion.   Respiratory:  Negative for cough, chest tightness and shortness of breath.   Cardiovascular:  Negative for chest pain, palpitations and leg swelling.  Gastrointestinal:  Positive for abdominal pain, constipation, nausea and vomiting. Negative for anal bleeding, blood in stool, diarrhea, flatus and rectal pain.  Genitourinary:  Positive for urgency. Negative for dysuria, flank pain, vaginal bleeding and vaginal discharge.  Musculoskeletal:  Negative for back  pain and neck pain.  Neurological:  Negative for light-headedness and headaches.  Psychiatric/Behavioral:  Negative for agitation and confusion.   All other systems reviewed and are negative.   Updated Vital Signs LMP 07/29/2011   Physical Exam Vitals and nursing note reviewed.  Constitutional:      General: She is not in acute distress.    Appearance: She is well-developed. She is not ill-appearing, toxic-appearing or diaphoretic.  HENT:     Head: Normocephalic and atraumatic.     Nose: No congestion or rhinorrhea.     Mouth/Throat:     Mouth: Mucous membranes are dry.     Pharynx: No oropharyngeal exudate or posterior oropharyngeal erythema.  Eyes:     Extraocular Movements: Extraocular movements intact.     Conjunctiva/sclera: Conjunctivae normal.     Pupils: Pupils are equal, round, and reactive to light.  Cardiovascular:     Rate and Rhythm: Normal rate and regular rhythm.     Pulses: Normal pulses.     Heart sounds: No murmur heard. Pulmonary:     Effort: Pulmonary effort is normal. No respiratory distress.     Breath sounds: Normal breath sounds. No wheezing, rhonchi or rales.  Chest:     Chest wall: No tenderness.  Abdominal:     Palpations: Abdomen is soft.     Tenderness: There is abdominal tenderness. There is no right CVA tenderness, left CVA tenderness, guarding or rebound.     Comments: Diffuse abdominal tenderness.  Musculoskeletal:        General: No swelling or tenderness.  Cervical back: Neck supple. No tenderness.     Right lower leg: No edema.     Left lower leg: No edema.  Skin:    General: Skin is warm and dry.     Capillary Refill: Capillary refill takes less than 2 seconds.     Findings: No erythema.  Neurological:     General: No focal deficit present.     Mental Status: She is alert.     Sensory: No sensory deficit.     Motor: No weakness.  Psychiatric:        Mood and Affect: Mood normal.     (all labs ordered are listed, but only  abnormal results are displayed) Labs Reviewed  CBC WITH DIFFERENTIAL/PLATELET - Abnormal; Notable for the following components:      Result Value   WBC 13.3 (*)    RBC 5.28 (*)    Hemoglobin 15.3 (*)    Neutro Abs 9.4 (*)    Eosinophils Absolute 0.6 (*)    All other components within normal limits  COMPREHENSIVE METABOLIC PANEL WITH GFR - Abnormal; Notable for the following components:   Sodium 134 (*)    Potassium 3.2 (*)    CO2 20 (*)    Glucose, Bld 125 (*)    All other components within normal limits  LIPASE, BLOOD - Abnormal; Notable for the following components:   Lipase 10 (*)    All other components within normal limits  URINALYSIS, W/ REFLEX TO CULTURE (INFECTION SUSPECTED) - Abnormal; Notable for the following components:   Specific Gravity, Urine >1.046 (*)    Bacteria, UA FEW (*)    All other components within normal limits  I-STAT CG4 LACTIC ACID, ED - Abnormal; Notable for the following components:   Lactic Acid, Venous 2.0 (*)    All other components within normal limits  I-STAT CG4 LACTIC ACID, ED    EKG: None  Radiology: CT ABDOMEN PELVIS W CONTRAST Result Date: 06/03/2024 EXAM: CT ABDOMEN AND PELVIS WITHOUT CONTRAST  06/03/2024 at 8:14 pm TECHNIQUE: CT of the abdomen and pelvis was performed with the administration of intravenous contrast. 100 ml omnipaque  300 was administered intravenously. Multiplanar reformatted images are provided for review. Automated exposure control, iterative reconstruction, and/or weight-based adjustment of the mA/kV was utilized to reduce the radiation dose to as low as reasonably achievable. COMPARISON: 02/18/2010 CLINICAL HISTORY: History of worsening pain over the last 2 weeks with nausea, vomiting, and decreased oral intake. Bloating. Similar symptoms on previous bowel obstruction. FINDINGS: LOWER CHEST: Mild dependent changes in the lung bases. LIVER: Mild diffuse fatty infiltration of the liver. GALLBLADDER AND BILE DUCTS:  Scattered gallbladder calcification. No gallstones or gallbladder wall thickening. This is developing since the previous study. Gallbladder calcification may be inflammatory or could indicate porcelain gallbladder, a rare condition associated with an increased risk of gallbladder cancer. No biliary ductal dilatation. SPLEEN: No acute abnormality. PANCREAS: No acute abnormality. ADRENAL GLANDS: No acute abnormality. KIDNEYS, URETERS AND BLADDER: No stones in the kidneys or ureters. No hydronephrosis. No perinephric or periureteral stranding. Urinary bladder is unremarkable. GI AND BOWEL: The stomach is filled with ingested material. No gastric wall thickening. Diffusely fluid-filled small and large bowel with evidence of bowel wall thickening. No large or small bowel distention. Findings likely to represent enterocolitis. Early or partial obstruction would be less likely given the nondistended appearance small bowel. The appendix is normal. PERITONEUM AND RETROPERITONEUM: No ascites. No free air. VASCULATURE: Aorta is normal in caliber. No aneurysm  or dissection. LYMPH NODES: No lymphadenopathy. REPRODUCTIVE ORGANS: The uterus is surgically absent. BONES AND SOFT TISSUES: No acute osseous abnormality. No focal soft tissue abnormality. IMPRESSION: 1. Findings consistent with enterocolitis. Early or partial obstruction is less likely given the nondistended appearance of the small bowel. 2. Peripheral gallbladder calcification, possibly inflammatory or representing porcelain gallbladder, which carries an increased risk of gallbladder cancer. No gallstones or gallbladder wall thickening. Electronically signed by: Elsie Gravely MD 06/03/2024 09:43 PM EST RP Workstation: [id number]     Procedures   Medications Ordered in the ED  HYDROmorphone  (DILAUDID ) injection 1 mg (1 mg Intravenous Given 06/03/24 1859)  ondansetron  (ZOFRAN ) injection 4 mg (4 mg Intravenous Given 06/03/24 1900)  sodium chloride  0.9 % bolus  1,000 mL (0 mLs Intravenous Stopped 06/03/24 2233)  iohexol  (OMNIPAQUE ) 300 MG/ML solution 100 mL (100 mLs Intravenous Contrast Given 06/03/24 2017)  potassium chloride  SA (KLOR-CON  M) CR tablet 20 mEq (20 mEq Oral Given 06/03/24 2234)  amoxicillin -clavulanate (AUGMENTIN ) 875-125 MG per tablet 1 tablet (1 tablet Oral Given 06/03/24 2234)                                    Medical Decision Making Amount and/or Complexity of Data Reviewed Labs: ordered. Radiology: ordered.  Risk Prescription drug management.    [name] is a 43 y.o. female with a past medical history significant for lupus, depression, anxiety, previous hysterectomy, and previous small bowel obstruction status post surgery who presents with nausea, vomiting, and abdominal pain.  According patient, this feels the same as her previous bowel obstruction about 10 years ago.  She denies recent trauma.  She reports that she has been having heartburn for the last 2 months or so but over the last 2 weeks started having more severe symptoms.  She has had nausea and vomiting and has not been tolerating p.o. over recently.  She reports she has had a change in her bowels over the last 2 weeks and is taking laxative just to pass some loose stool.  She reports no bleeding rectally.  Denies any rectal pain.  She denies any pain in her back or flanks.  Denies any dysuria or hematuria but does report some urgency.  Denies any vaginal complaints.  She denies any fevers, chills, congestion, cough.  Denies any chest pain or shortness of breath.  She reports epigastric burning and aching leading up to this.  On exam, she has diffuse abdominal tenderness.  I did hear some bowel sounds.  Back and flanks nontender.  No murmur.  Chest nontender.  Patient has intact pulses in extremities.  Legs nontender and nonedematous.  She has dry because membranes.  Will give her some pain medicine, nausea send of fluids and keep her NPO.  We will get a CT  scan given her history of previous bowel obstruction and surgery and similar pains.  Will get urine given the urgency and some screening labs.  Anticipate reassessment after workup to determine disposition.  10:02 PM Workup continues to return.  CT scan just returned showing no clear obstruction.  Calcification of the gallbladder was discussed.  Given the suspected enterocolitis and the patient's wall thickening, will likely treat with outpatient antibiotics nausea medicine and pain medicine.  Patient will get a p.o. challenge.  Her potassium was slightly low so we will give her some here.  If she passes p.o. challenge, anticipate discharge with return for  pain medicine, nausea medicine, and antibiotics and close PCP follow-up.  Patient understand return precautions, anticipate reassessment and discharge.   10:34 PM Urinalysis does not show UTI.  She is still feeling well.  Will discharge for outpatient follow-up and will give her follow-up instructions with GI as well.  Patient agrees to this plan and return precautions and was discharged in good condition.       Final diagnoses:  Lower urinary tract infectious disease  Enterocolitis  Diarrhea, unspecified type  Nausea and vomiting, unspecified vomiting type  Dehydration  Abdominal pain, unspecified abdominal location    ED Discharge Orders          Ordered    amoxicillin -clavulanate (AUGMENTIN ) 875-125 MG tablet  Every 12 hours        06/03/24 2238    oxyCODONE  (ROXICODONE ) 5 MG immediate release tablet  Every 4 hours PRN        06/03/24 2238    ondansetron  (ZOFRAN -ODT) 4 MG disintegrating tablet  Every 8 hours PRN        06/03/24 2238            Clinical Impression: 1. Lower urinary tract infectious disease   2. Enterocolitis   3. Diarrhea, unspecified type   4. Nausea and vomiting, unspecified vomiting type   5. Dehydration   6. Abdominal pain, unspecified abdominal location     Disposition:  Discharge  Condition: Good  I have discussed the results, Dx and Tx plan with the pt(& family if present). He/she/they expressed understanding and agree(s) with the plan. Discharge instructions discussed at great length. Strict return precautions discussed and pt &/or family have verbalized understanding of the instructions. No further questions at time of discharge.    New Prescriptions   AMOXICILLIN -CLAVULANATE (AUGMENTIN ) 875-125 MG TABLET    Take 1 tablet by mouth every 12 (twelve) hours for 10 days.   ONDANSETRON  (ZOFRAN -ODT) 4 MG DISINTEGRATING TABLET    Take 1 tablet (4 mg total) by mouth every 8 (eight) hours as needed for nausea or vomiting.   OXYCODONE  (ROXICODONE ) 5 MG IMMEDIATE RELEASE TABLET    Take 1 tablet (5 mg total) by mouth every 4 (four) hours as needed for severe pain (pain score 7-10).    Follow Up: [facility] [address] [place]  [address] [phone] Schedule an appointment as soon as possible for a visit    Gastroenterology, [place] [address] [place] [address] [place] [address] [phone]     [facility] [address] [place]  [address] [phone]         Chinelo Benn, Lonni PARAS, MD 06/03/24 2242

## 2024-06-03 NOTE — ED Notes (Addendum)
 Attempted IV access on pt, unsuccessful, unable to find another access point. Pt will need US  IV access. IV team consult placed

## 2024-06-03 NOTE — Discharge Instructions (Signed)
 Your history, exam and workup today did not reveal clear evidence of bowel obstruction or partial obstruction however there was evidence of some inflammation and colitis.  Please take the antibiotics to treat this and please use the nausea and pain medicine that was symptoms.  Please rest and stay hydrated.  Your urine did not show infection and your labs are otherwise similar to prior.  We feel you are safe for discharge home but please follow-up with outpatient PCP and the GI team.  Please call one of the 2 GI groups to get seen soon as possible.  If any symptoms change or worsen acutely, please return to the nearest emergency department.

## 2024-06-22 ENCOUNTER — Other Ambulatory Visit: Payer: Self-pay | Admitting: Internal Medicine

## 2024-06-22 DIAGNOSIS — K311 Adult hypertrophic pyloric stenosis: Secondary | ICD-10-CM

## 2024-08-05 ENCOUNTER — Inpatient Hospital Stay: Admission: RE | Admit: 2024-08-05 | Payer: MEDICAID | Source: Ambulatory Visit
